# Patient Record
Sex: Female | Born: 1965 | Race: White | Hispanic: No | Marital: Married | Smoking: Former smoker
Health system: Southern US, Community
[De-identification: ages and names within clinical notes are randomized; demographics above are authoritative.]

## PROBLEM LIST (undated history)

## (undated) DIAGNOSIS — F329 Major depressive disorder, single episode, unspecified: Secondary | ICD-10-CM

## (undated) DIAGNOSIS — F419 Anxiety disorder, unspecified: Secondary | ICD-10-CM

## (undated) DIAGNOSIS — F32A Depression, unspecified: Secondary | ICD-10-CM

## (undated) HISTORY — DX: Depression, unspecified: F32.A

## (undated) HISTORY — DX: Anxiety disorder, unspecified: F41.9

## (undated) HISTORY — DX: Major depressive disorder, single episode, unspecified: F32.9

## (undated) HISTORY — PX: TUBAL LIGATION: SHX77

## (undated) HISTORY — PX: OTHER SURGICAL HISTORY: SHX169

---

## 2011-11-26 ENCOUNTER — Other Ambulatory Visit: Payer: Self-pay | Admitting: Obstetrics and Gynecology

## 2011-11-26 DIAGNOSIS — R928 Other abnormal and inconclusive findings on diagnostic imaging of breast: Secondary | ICD-10-CM

## 2011-12-04 ENCOUNTER — Other Ambulatory Visit: Payer: Self-pay

## 2011-12-09 ENCOUNTER — Ambulatory Visit
Admission: RE | Admit: 2011-12-09 | Discharge: 2011-12-09 | Disposition: A | Discharge status: Home / Self Care | Payer: BC Managed Care – PPO | Source: Ambulatory Visit | Attending: Obstetrics and Gynecology | Admitting: Obstetrics and Gynecology

## 2011-12-09 DIAGNOSIS — R928 Other abnormal and inconclusive findings on diagnostic imaging of breast: Secondary | ICD-10-CM

## 2012-12-16 ENCOUNTER — Other Ambulatory Visit: Payer: Self-pay | Admitting: Family Medicine

## 2012-12-17 NOTE — Telephone Encounter (Signed)
LAST RF 09/27/12. CALL IN [facility]

## 2012-12-20 ENCOUNTER — Other Ambulatory Visit: Payer: Self-pay | Admitting: Family Medicine

## 2013-04-12 ENCOUNTER — Other Ambulatory Visit: Payer: Self-pay | Admitting: Nurse Practitioner

## 2013-04-13 NOTE — Telephone Encounter (Signed)
Last seen 09/09/12  MMM  If approved route to nurse to call in

## 2013-04-13 NOTE — Telephone Encounter (Signed)
Please call in ambien rx with 0 refill- NTBS for future refills

## 2013-04-13 NOTE — Telephone Encounter (Signed)
Med called to pharm 

## 2013-04-15 ENCOUNTER — Other Ambulatory Visit: Payer: Self-pay | Admitting: Nurse Practitioner

## 2013-06-15 ENCOUNTER — Other Ambulatory Visit: Payer: Self-pay | Admitting: Nurse Practitioner

## 2013-06-17 NOTE — Telephone Encounter (Signed)
Last seen 09/09/12, last filled 04/12/13. Route to pool if approved and call into [place]

## 2013-06-17 NOTE — Telephone Encounter (Signed)
Rx called to [facility].

## 2013-06-17 NOTE — Telephone Encounter (Signed)
Please call in ambien rx 

## 2013-08-19 ENCOUNTER — Encounter: Payer: Self-pay | Admitting: General Practice

## 2013-08-19 ENCOUNTER — Ambulatory Visit (INDEPENDENT_AMBULATORY_CARE_PROVIDER_SITE_OTHER): Payer: 59 | Admitting: General Practice

## 2013-08-19 VITALS — BP 139/90 | HR 73 | Temp 97.5°F | Ht 64.0 in | Wt 217.5 lb

## 2013-08-19 DIAGNOSIS — R635 Abnormal weight gain: Secondary | ICD-10-CM

## 2013-08-19 DIAGNOSIS — Z713 Dietary counseling and surveillance: Secondary | ICD-10-CM

## 2013-08-19 NOTE — Patient Instructions (Signed)

## 2013-08-19 NOTE — Progress Notes (Signed)
   Subjective:    Patient ID: Leslie Abbott, female    DOB: 1966-07-29, 48 y.o.   MRN: [id number]  HPI Patient presents today stating, " I want to discuss losing weight." Reports she has tried to walk, 3 times a week, but discontinued due to lack of will power. Reports eating a non healthy diet including sausage biscuits, pastas, and  breads. Discussed importance of healthy eating and regular exercise for weight loss. Patient interested in weight loss medication. Denies having any lab work in many years and declines today. Provider explained importance of having basic labs performed.    Review of Systems  Constitutional: Negative for fever and chills.  Respiratory: Negative for chest tightness and shortness of breath.   Cardiovascular: Negative for chest pain and palpitations.  All other systems reviewed and are negative.       Objective:   Physical Exam  Constitutional: She is oriented to person, place, and time. She appears well-developed and well-nourished.  obese  HENT:  Head: Normocephalic and atraumatic.  Right Ear: External ear normal.  Left Ear: External ear normal.  Mouth/Throat: Oropharynx is clear and moist.  Eyes: Pupils are equal, round, and reactive to light.  Neck: Normal range of motion. Neck supple.  Cardiovascular: Normal rate, regular rhythm and normal heart sounds.   Pulmonary/Chest: Effort normal and breath sounds normal. No respiratory distress. She exhibits no tenderness.  Abdominal: Bowel sounds are normal. She exhibits no distension. There is no tenderness.  Neurological: She is alert and oriented to person, place, and time.  Skin: Skin is warm and dry.  Psychiatric: She has a normal mood and affect.          Assessment & Plan:  1. Weight loss counseling, encounter for -discussed importance of having basic labs drawn -discussed lifestyle modifications for weight reduction (such as healthy eating and some form of regular exercise) -should meet with  nutritionist to develop healthy eating plan -Patient verbalized understanding Coralie KeensMae E. Zellie Jenning, FNP-C

## 2013-09-08 ENCOUNTER — Ambulatory Visit (INDEPENDENT_AMBULATORY_CARE_PROVIDER_SITE_OTHER): Payer: 59 | Admitting: Pharmacist

## 2013-09-08 ENCOUNTER — Encounter: Payer: Self-pay | Admitting: Pharmacist

## 2013-09-08 VITALS — BP 120/72 | HR 74 | Ht 64.0 in | Wt 211.0 lb

## 2013-09-08 DIAGNOSIS — R635 Abnormal weight gain: Secondary | ICD-10-CM

## 2013-09-08 DIAGNOSIS — E669 Obesity, unspecified: Secondary | ICD-10-CM | POA: Insufficient documentation

## 2013-09-08 NOTE — Progress Notes (Signed)
Subjective:     Leslie Abbott is a 48 y.o. female here for discussion regarding weight loss. She has noted a weight gain of approximately 50 pounds over the last 3 years. She feels ideal weight is 170 pounds. Weight at graduation from high school was 145 pounds. History of eating disorders: none. There is a family history positive for obesity in the patient and sister. Previous treatments for obesity include commercial weight loss program: - [employer] [facility], self-directed dieting and Weight Watchers. She reports that she lost about 50 lbs with [employer] and about 15 lbs with [employer].  Obesity associated medical conditions: depression and hyperlipidemia. Obesity associated medications: none. Cardiovascular risk factors besides obesity: dyslipidemia, obesity (BMI >= 30 kg/m2) and sedentary lifestyle. . The following portions of the patient's history were reviewed and updated as appropriate: allergies, current medications, past family history, past medical history, past social history, past surgical history and problem list.  Reviewed labs from 2013 in patients paper chart - showed hyperlipidemia and vitamin D insufficiency in 2013.  Objective:    Body mass index is 36.2 kg/(m^2).  Filed Weights   09/08/13 0935  Weight: 211 lb (95.709 kg)    Filed Vitals:   09/08/13 0935  BP: 120/72  Pulse: 74      Assessment:    Obesity. I assessed [name] to be in an action stage with respect to weight loss.    Plan:    General weight loss/lifestyle modification strategies discussed (elicit support from others; identify saboteurs; non-food rewards, etc). Behavioral treatment: stress management. Diet interventions: moderate (500 kCal/d) deficit diet. Informal exercise measures discussed, e.g. taking stairs instead of elevator.  Patient is encouraged to have lipids, thyroid, vitamin D and CMP checked - she refused labs tests.    RTC in 4-6 weeks.   Henrene Pastorammy Cori Justus, PharmD, CPP

## 2013-09-26 ENCOUNTER — Other Ambulatory Visit: Payer: Self-pay | Admitting: Nurse Practitioner

## 2013-09-27 NOTE — Telephone Encounter (Signed)
Last seen 08/19/13  Leslie Abbott  If approved route to nurse to call into [facility] 

## 2013-09-30 ENCOUNTER — Other Ambulatory Visit: Payer: Self-pay | Admitting: Nurse Practitioner

## 2013-10-03 NOTE — Telephone Encounter (Signed)
Last seen 08/19/13  Leslie Abbott  [facility]

## 2013-10-04 ENCOUNTER — Other Ambulatory Visit: Payer: Self-pay | Admitting: General Practice

## 2013-10-11 ENCOUNTER — Other Ambulatory Visit: Payer: Self-pay | Admitting: Nurse Practitioner

## 2013-10-14 NOTE — Telephone Encounter (Signed)
Patient last seen in office on 1-22 by Tammy and on 1-2 by Mae. Please advise. If approved please route to Pool B so nurse can phone in to pharmacy

## 2013-10-14 NOTE — Telephone Encounter (Signed)
Rx called to [facility]

## 2013-10-14 NOTE — Telephone Encounter (Signed)
Please call in amben with 0 refills

## 2013-10-20 ENCOUNTER — Ambulatory Visit: Payer: Self-pay

## 2013-10-20 ENCOUNTER — Telehealth: Payer: Self-pay | Admitting: *Deleted

## 2013-10-20 NOTE — Telephone Encounter (Signed)
Let patien tknow that ins will only cover the plain ambien not the CR- is this okay with her

## 2013-10-20 NOTE — Telephone Encounter (Signed)
Ok

## 2013-10-20 NOTE — Telephone Encounter (Signed)
MM, I got a letter back on the zolpidem saying they cannot perform this prior authorization  because this product is a plan exclusion for this member so there is no coverage criteria to review, it says she can call the plan for more info , so I'm going to try tp call her, will keep you posted.

## 2013-10-20 NOTE — Telephone Encounter (Signed)
MM [name] called back and said the options were zolpedem and zalepalon, I called the ins co back and they said they will cover zolpidem either 5mg  or 10 mg but not the tartrate er and they will cover sonata.  Will any of these work?  Thanks

## 2013-10-25 ENCOUNTER — Telehealth: Payer: Self-pay | Admitting: *Deleted

## 2013-10-25 NOTE — Telephone Encounter (Signed)
Talked with Leslie Abbott today and she wants to try the zolpidem plain if you would send to [facility] she would appreciate it.  thanks

## 2013-10-25 NOTE — Telephone Encounter (Signed)
Please let patient know [place]

## 2013-10-28 ENCOUNTER — Telehealth: Payer: Self-pay | Admitting: Nurse Practitioner

## 2013-10-28 MED ORDER — ZOLPIDEM TARTRATE 10 MG PO TABS: 10.0000 mg | ORAL_TABLET | Freq: Every evening | ORAL | Status: DC | PRN

## 2013-10-28 NOTE — Telephone Encounter (Signed)
Left refill authorization on pharmacy voicemail. 

## 2013-10-28 NOTE — Telephone Encounter (Signed)
Talked with [name] and told her that mm would be calling in her medication for sleep today.

## 2013-10-28 NOTE — Telephone Encounter (Signed)
Ambien was called into pharmacy 

## 2013-10-28 NOTE — Telephone Encounter (Signed)
Please call in ambien 10 mg  1 po qhs #30 with 1 refills 

## 2013-10-28 NOTE — Telephone Encounter (Signed)
Patient ok with plain [place]

## 2013-10-31 ENCOUNTER — Telehealth: Payer: Self-pay | Admitting: *Deleted

## 2013-10-31 NOTE — Telephone Encounter (Signed)
Called [facility] on 3-13 1 and told her that MM had called in her Remus Lofflerambien and she said that was find.

## 2013-11-05 IMAGING — MG MM DIGITAL DIAGNOSTIC UNILAT*R*
4 series · 4 of 4 positions shown · non-contrast
Comparison: 11/07/2010, 10/24/2009, 09/05/2008 from [REDACTED].

CLINICAL DATA: The patient returns for evaluation of a possible
mass in the right breast noted on recent screening study dated
11/20/2011.

DIGITAL DIAGNOSTIC RIGHT MAMMOGRAM

[R CC]
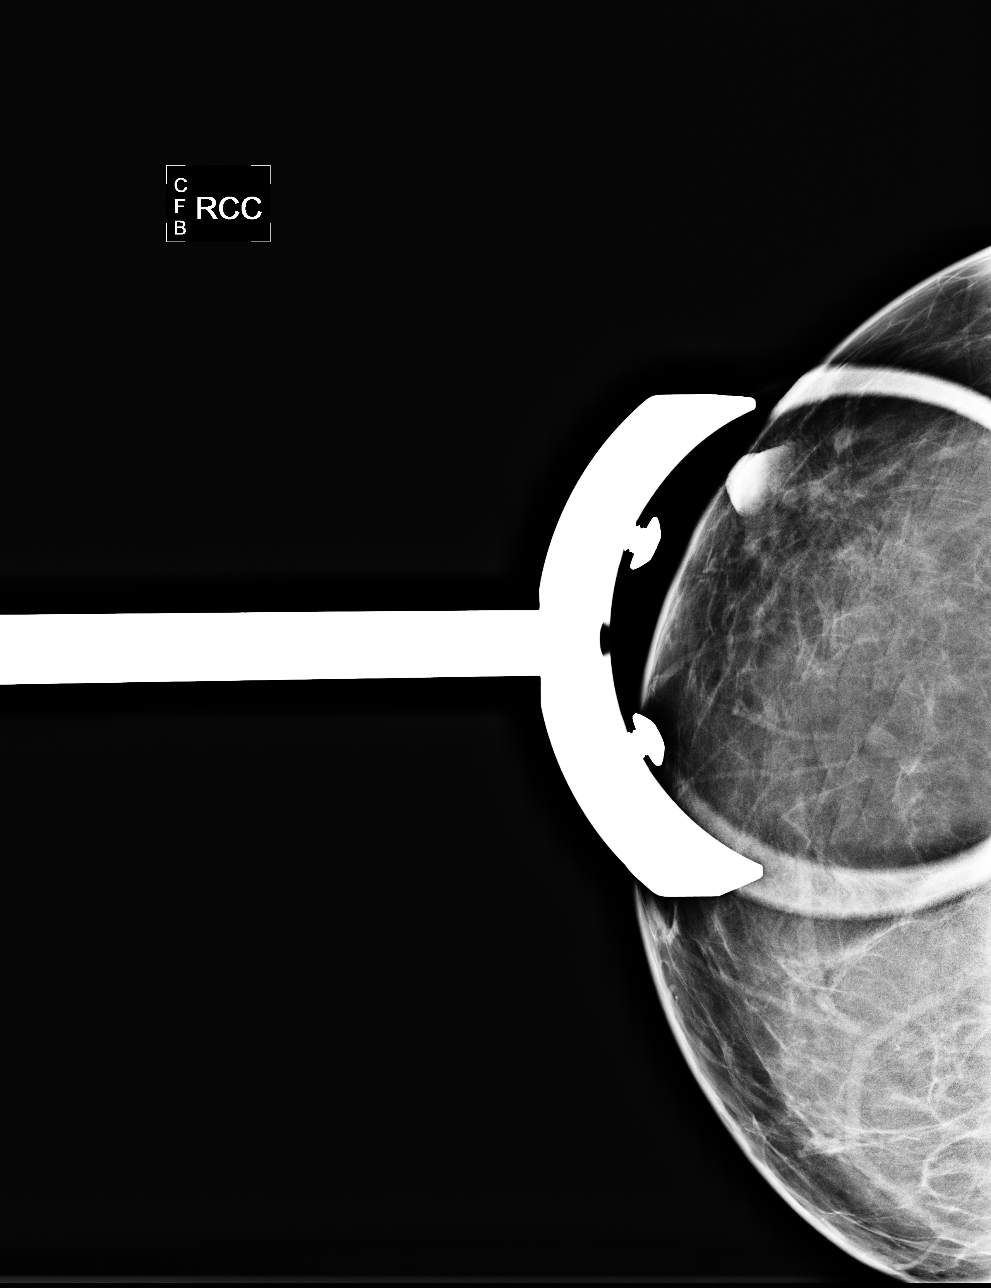

[R MLO (1 of 3)]
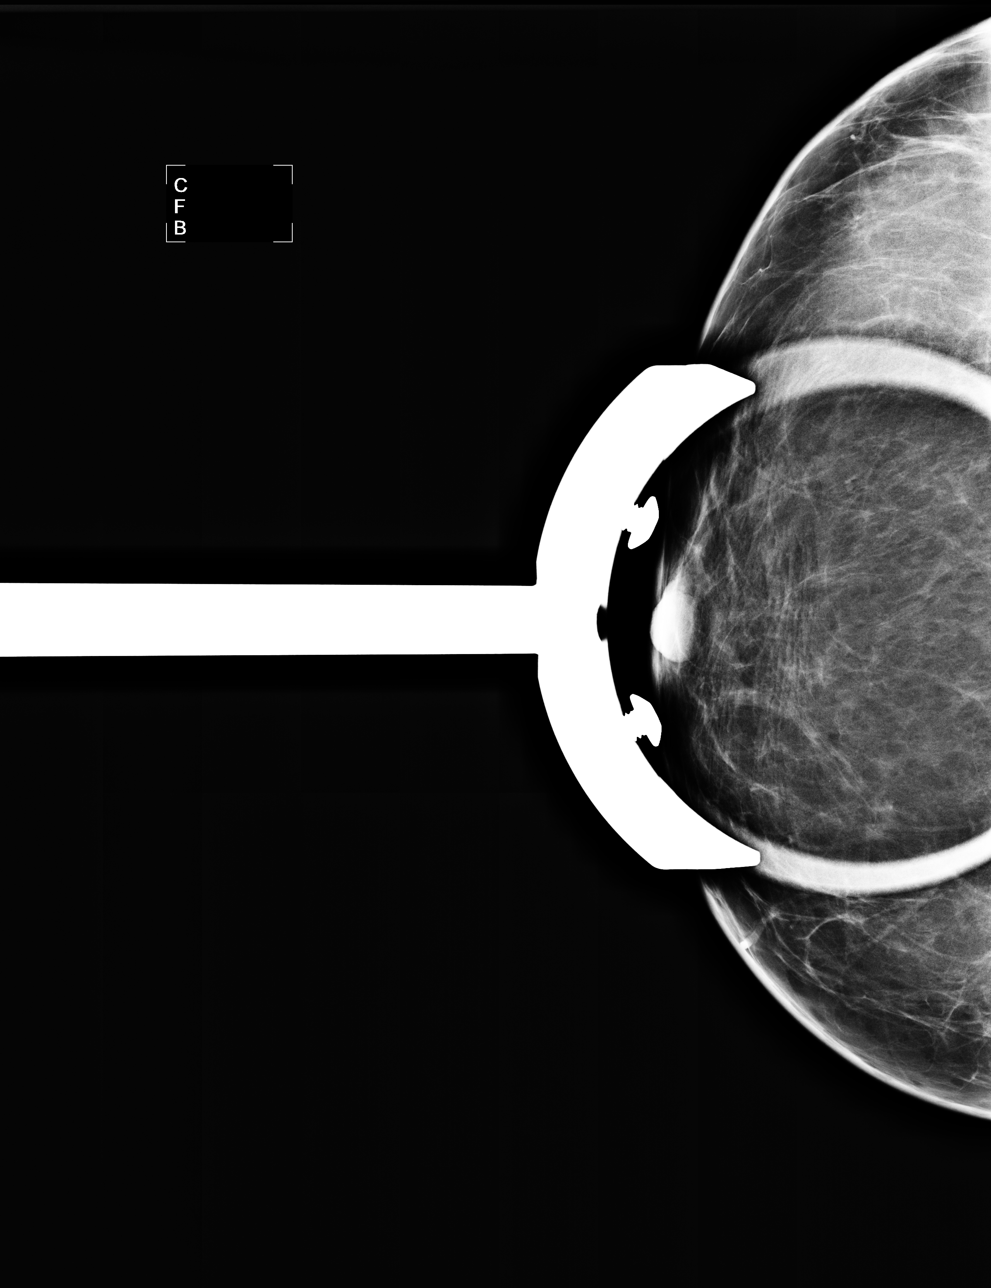

[R MLO (2 of 3)]
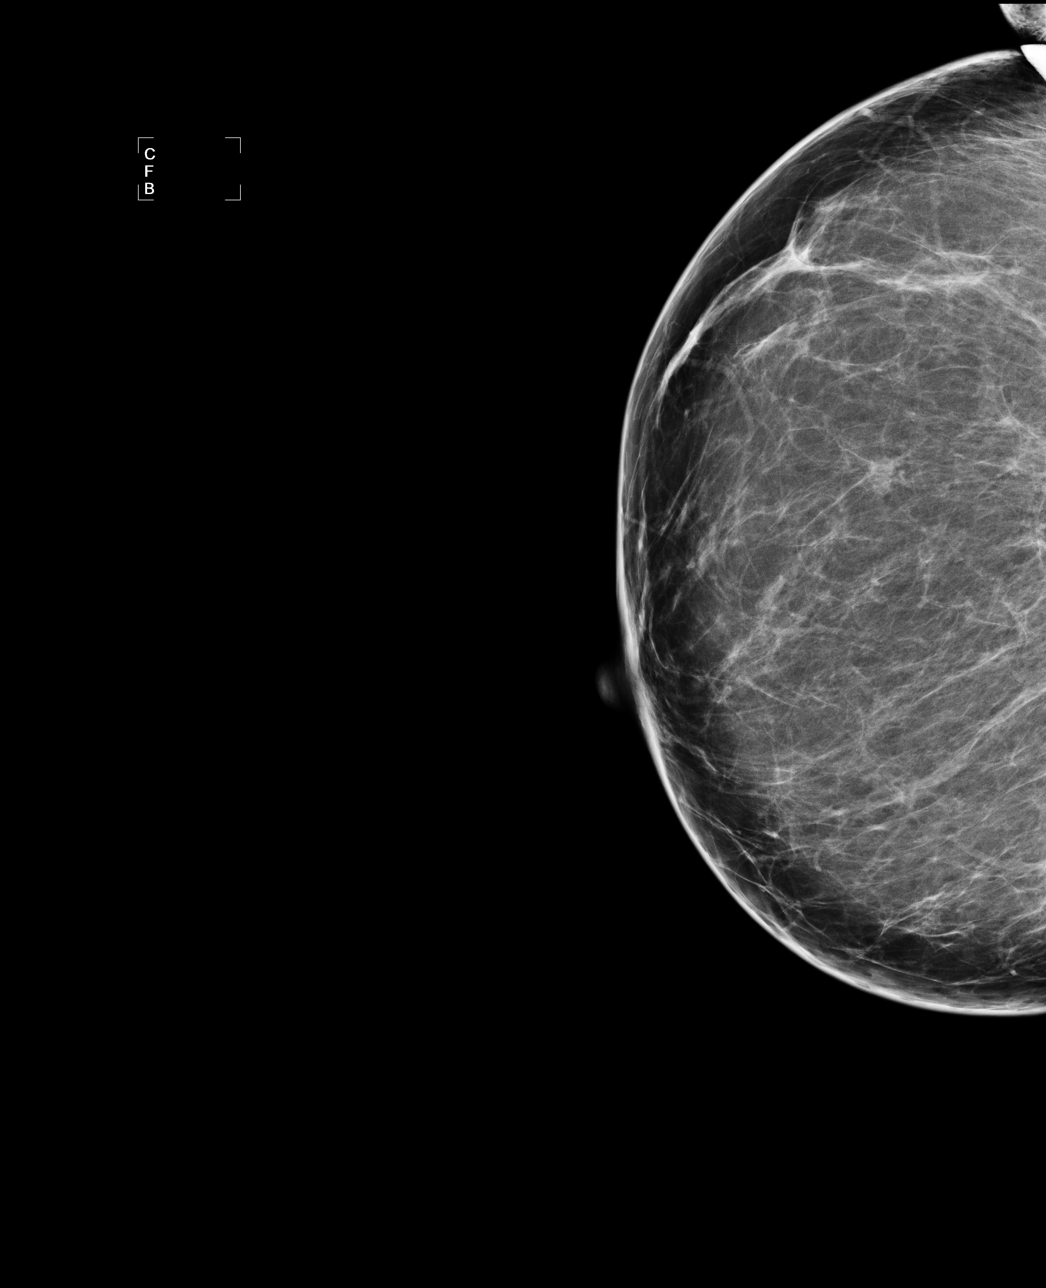

[R MLO (3 of 3)]
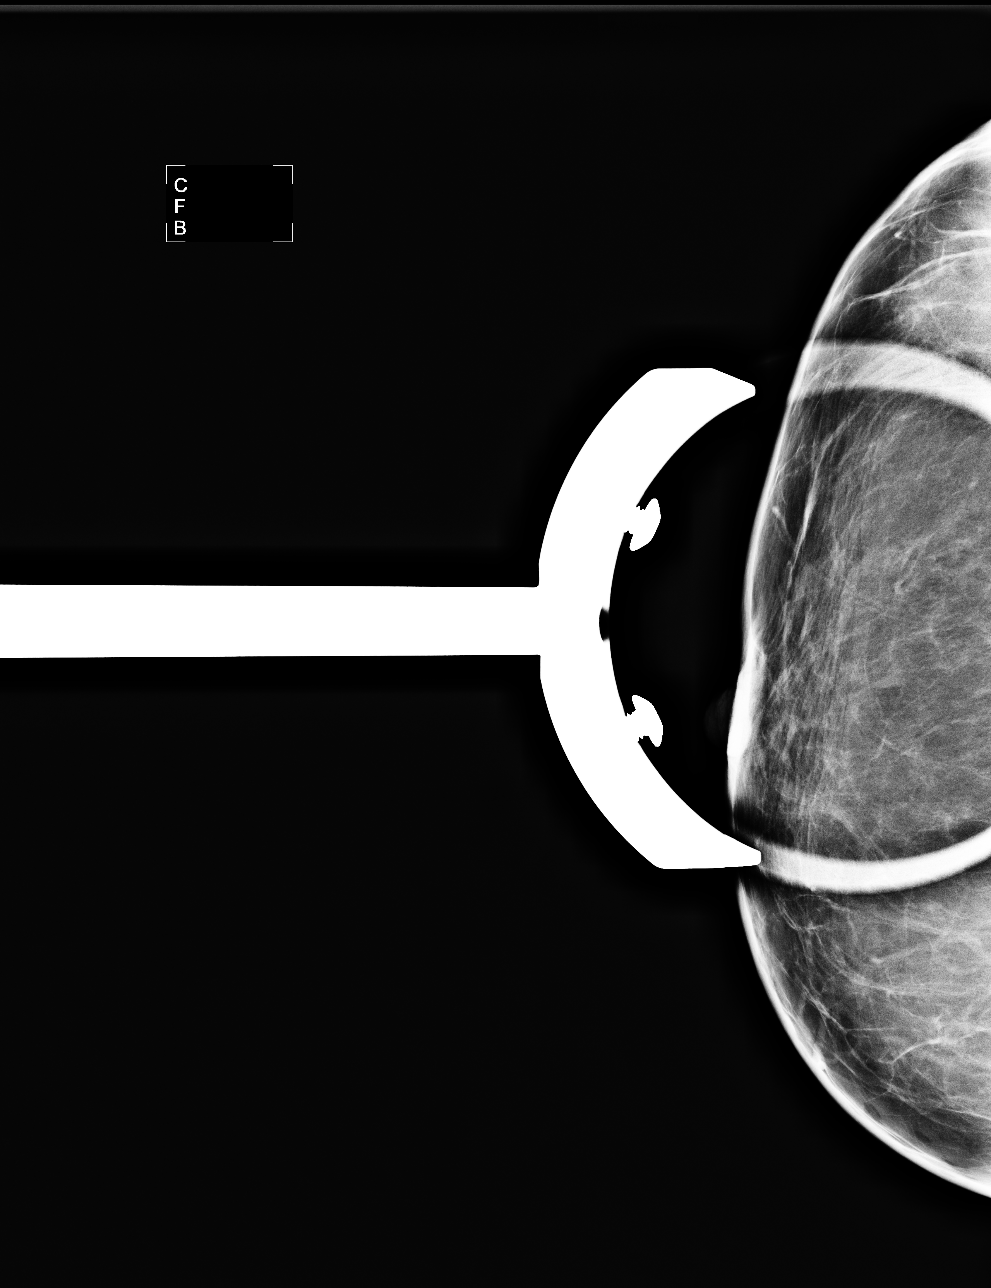

[4 of 4 positions shown; findings below may reference images not displayed]

FINDINGS: Additional views demonstrate no persistent mass or
distortion in the right subareolar region.
IMPRESSION: No persistent worrisome abnormality upon additional imaging of the
right breast.  Yearly screening mammography is suggested.

BI-RADS CATEGORY 1:  Negative.

## 2014-07-24 ENCOUNTER — Other Ambulatory Visit: Payer: Self-pay | Admitting: Nurse Practitioner

## 2014-08-02 ENCOUNTER — Other Ambulatory Visit: Payer: Self-pay | Admitting: Nurse Practitioner

## 2014-08-03 ENCOUNTER — Other Ambulatory Visit: Payer: Self-pay | Admitting: Nurse Practitioner

## 2014-09-27 ENCOUNTER — Encounter: Payer: Self-pay | Admitting: Nurse Practitioner

## 2014-09-27 ENCOUNTER — Ambulatory Visit (INDEPENDENT_AMBULATORY_CARE_PROVIDER_SITE_OTHER): Payer: 59 | Admitting: Nurse Practitioner

## 2014-09-27 VITALS — BP 150/98 | HR 72 | Temp 97.3°F | Ht 64.0 in | Wt 218.0 lb

## 2014-09-27 DIAGNOSIS — F17200 Nicotine dependence, unspecified, uncomplicated: Secondary | ICD-10-CM

## 2014-09-27 DIAGNOSIS — G4700 Insomnia, unspecified: Secondary | ICD-10-CM

## 2014-09-27 DIAGNOSIS — E669 Obesity, unspecified: Secondary | ICD-10-CM

## 2014-09-27 MED ORDER — ZOLPIDEM TARTRATE 10 MG PO TABS: 10.0000 mg | ORAL_TABLET | Freq: Every evening | ORAL | Status: DC | PRN

## 2014-09-27 MED ORDER — VARENICLINE TARTRATE 0.5 MG X 11 & 1 MG X 42 PO MISC: ORAL | Status: DC

## 2014-09-27 MED ORDER — VARENICLINE TARTRATE 0.5 MG PO TABS: 0.5000 mg | ORAL_TABLET | Freq: Two times a day (BID) | ORAL | Status: DC

## 2014-09-27 NOTE — Progress Notes (Signed)
   Subjective:    Patient ID: Leslie Abbott, female    DOB: 1966-08-10, 49 y.o.   MRN: [id number]  HPI Patient is here for chronic disease follow up. She reports doing well. No acute complaint today. She reports wanting to start chantix to help with smoking cessation.   Insomnia: currently taking Ambien and is doing well, no side effect reported.   Review of Systems  Constitutional: Negative.   HENT: Negative.   Eyes: Negative.   Respiratory: Negative.   Cardiovascular: Negative.   Gastrointestinal: Negative.   Endocrine: Negative.   Genitourinary: Negative.   Musculoskeletal: Negative.   Skin: Negative.   Allergic/Immunologic: Negative.   Neurological: Negative.   Hematological: Negative.   Psychiatric/Behavioral: Negative.        Objective:   Physical Exam  Constitutional: She is oriented to person, place, and time. She appears well-developed and well-nourished.  HENT:  Head: Normocephalic.  Eyes: Pupils are equal, round, and reactive to light.  Neck: Normal range of motion.  Cardiovascular: Normal rate.   Pulmonary/Chest: Effort normal.  Abdominal: Soft.  Musculoskeletal: Normal range of motion.  Neurological: She is alert and oriented to person, place, and time.  Skin: Skin is warm.  Psychiatric: She has a normal mood and affect. Her behavior is normal. Judgment and thought content normal.    BP 150/98 mmHg  Pulse 72  Temp(Src) 97.3 F (36.3 C) (Oral)  Ht _0  (1.626 m)  Wt 218 lb (98.884 kg)  BMI 37.40 kg/m2       Assessment & Plan:  1. Obesity (BMI 30-39.9) Discussed diet and exercise for person with BMI >25 Will recheck weight in 3-6 months  - NMR, lipoprofile - CMP14+EGFR  2. Insomnia Bedtime ritual - ambien 50m 1 po qHS #30 2 rf 3. Chain smoker Smoke th efirst 13 days that take meds- then STOP! - varenicline (CHANTIX STARTING MONTH PAK) 0.5 MG X 11 & 1 MG X 42 tablet; As directed on package  Dispense: 53 tablet; Refill: 0 - varenicline  (CHANTIX) 0.5 MG tablet; Take 1 tablet (0.5 mg total) by mouth 2 (two) times daily.  Dispense: 60 tablet; Refill: 2    Labs pending Health maintenance reviewed Diet and exercise encouraged Continue all meds Follow up  In 6 months   [place] FNP

## 2014-09-27 NOTE — Patient Instructions (Signed)
Smoking Cessation Quitting smoking is important to your health and has many advantages. However, it is not always easy to quit since nicotine is a very addictive drug. Oftentimes, people try 3 times or more before being able to quit. This document explains the best ways for you to prepare to quit smoking. Quitting takes hard work and a lot of effort, but you can do it. ADVANTAGES OF QUITTING SMOKING  You will live longer, feel better, and live better.  Your body will feel the impact of quitting smoking almost immediately.  Within 20 minutes, blood pressure decreases. Your pulse returns to its normal level.  After 8 hours, carbon monoxide levels in the blood return to normal. Your oxygen level increases.  After 24 hours, the chance of having a heart attack starts to decrease. Your breath, hair, and body stop smelling like smoke.  After 48 hours, damaged nerve endings begin to recover. Your sense of taste and smell improve.  After 72 hours, the body is virtually free of nicotine. Your bronchial tubes relax and breathing becomes easier.  After 2 to 12 weeks, lungs can hold more air. Exercise becomes easier and circulation improves.  The risk of having a heart attack, stroke, cancer, or lung disease is greatly reduced.  After 1 year, the risk of coronary heart disease is cut in half.  After 5 years, the risk of stroke falls to the same as a nonsmoker.  After 10 years, the risk of lung cancer is cut in half and the risk of other cancers decreases significantly.  After 15 years, the risk of coronary heart disease drops, usually to the level of a nonsmoker.  If you are pregnant, quitting smoking will improve your chances of having a healthy baby.  The people you live with, especially any children, will be healthier.  You will have extra money to spend on things other than cigarettes. QUESTIONS TO THINK ABOUT BEFORE ATTEMPTING TO QUIT You may want to talk about your answers with your  health care provider.  Why do you want to quit?  If you tried to quit in the past, what helped and what did not?  What will be the most difficult situations for you after you quit? How will you plan to handle them?  Who can help you through the tough times? Your family? Friends? A health care provider?  What pleasures do you get from smoking? What ways can you still get pleasure if you quit? Here are some questions to ask your health care provider:  How can you help me to be successful at quitting?  What medicine do you think would be best for me and how should I take it?  What should I do if I need more help?  What is smoking withdrawal like? How can I get information on withdrawal? GET READY  Set a quit date.  Change your environment by getting rid of all cigarettes, ashtrays, matches, and lighters in your home, car, or work. Do not let people smoke in your home.  Review your past attempts to quit. Think about what worked and what did not. GET SUPPORT AND ENCOURAGEMENT You have a better chance of being successful if you have help. You can get support in many ways.  Tell your family, friends, and coworkers that you are going to quit and need their support. Ask them not to smoke around you.  Get individual, group, or telephone counseling and support. Programs are available at local hospitals and health centers. Call   your local health department for information about programs in your area.  Spiritual beliefs and practices may help some smokers quit.  Download a "quit meter" on your computer to keep track of quit statistics, such as how long you have gone without smoking, cigarettes not smoked, and money saved.  Get a self-help book about quitting smoking and staying off tobacco. LEARN NEW SKILLS AND BEHAVIORS  Distract yourself from urges to smoke. Talk to someone, go for a walk, or occupy your time with a task.  Change your normal routine. Take a different route to work.  Drink tea instead of coffee. Eat breakfast in a different place.  Reduce your stress. Take a hot bath, exercise, or read a book.  Plan something enjoyable to do every day. Reward yourself for not smoking.  Explore interactive web-based programs that specialize in helping you quit. GET MEDICINE AND USE IT CORRECTLY Medicines can help you stop smoking and decrease the urge to smoke. Combining medicine with the above behavioral methods and support can greatly increase your chances of successfully quitting smoking.  Nicotine replacement therapy helps deliver nicotine to your body without the negative effects and risks of smoking. Nicotine replacement therapy includes nicotine gum, lozenges, inhalers, nasal sprays, and skin patches. Some may be available over-the-counter and others require a prescription.  Antidepressant medicine helps people abstain from smoking, but how this works is unknown. This medicine is available by prescription.  Nicotinic receptor partial agonist medicine simulates the effect of nicotine in your brain. This medicine is available by prescription. Ask your health care provider for advice about which medicines to use and how to use them based on your health history. Your health care provider will tell you what side effects to look out for if you choose to be on a medicine or therapy. Carefully read the information on the package. Do not use any other product containing nicotine while using a nicotine replacement product.  RELAPSE OR DIFFICULT SITUATIONS Most relapses occur within the first 3 months after quitting. Do not be discouraged if you start smoking again. Remember, most people try several times before finally quitting. You may have symptoms of withdrawal because your body is used to nicotine. You may crave cigarettes, be irritable, feel very hungry, cough often, get headaches, or have difficulty concentrating. The withdrawal symptoms are only temporary. They are strongest  when you first quit, but they will go away within 10-14 days. To reduce the chances of relapse, try to:  Avoid drinking alcohol. Drinking lowers your chances of successfully quitting.  Reduce the amount of caffeine you consume. Once you quit smoking, the amount of caffeine in your body increases and can give you symptoms, such as a rapid heartbeat, sweating, and anxiety.  Avoid smokers because they can make you want to smoke.  Do not let weight gain distract you. Many smokers will gain weight when they quit, usually less than 10 pounds. Eat a healthy diet and stay active. You can always lose the weight gained after you quit.  Find ways to improve your mood other than smoking. FOR MORE INFORMATION  www.smokefree.gov  Document Released: 07/29/2001 Document Revised: 12/19/2013 Document Reviewed: 11/13/2011 Exit[employer] Patient Information 2015 [employer]. This information is not intended to replace advice given to you by your health care provider. Make sure you discuss any questions you have with your health care provider.  

## 2014-10-25 ENCOUNTER — Other Ambulatory Visit: Payer: Self-pay | Admitting: Nurse Practitioner

## 2014-10-26 ENCOUNTER — Other Ambulatory Visit: Payer: Self-pay | Admitting: *Deleted

## 2014-10-26 MED ORDER — VARENICLINE TARTRATE 1 MG PO TABS: 1.0000 mg | ORAL_TABLET | Freq: Two times a day (BID) | ORAL | Status: DC

## 2015-01-08 ENCOUNTER — Ambulatory Visit: Payer: 59 | Admitting: Family Medicine

## 2015-03-09 ENCOUNTER — Ambulatory Visit: Payer: 59 | Admitting: Family

## 2015-03-14 ENCOUNTER — Encounter: Payer: Self-pay | Admitting: Family

## 2015-03-14 ENCOUNTER — Ambulatory Visit (INDEPENDENT_AMBULATORY_CARE_PROVIDER_SITE_OTHER): Payer: 59 | Admitting: Family

## 2015-03-14 VITALS — BP 123/85 | HR 83 | Temp 97.8°F | Ht 64.0 in | Wt 216.6 lb

## 2015-03-14 DIAGNOSIS — L0291 Cutaneous abscess, unspecified: Secondary | ICD-10-CM

## 2015-03-14 NOTE — Patient Instructions (Signed)

## 2015-03-14 NOTE — Progress Notes (Signed)
   Subjective:    Patient ID: Leslie Abbott, female    DOB: [birth year], 49 y.o.   MRN: [id number]  HPI Pt presents to the office today for a abscess under her right axillary. Pt states she noticed it a few weeks ago, but last week in the shower she noticed it had gotten bigger. Pt states last Thursday she went to a Dermatologists  who placed her on Doxycycline for 14 days and told her to use warm compresses. Pt states it started draining "white stuff" on Saturday. Pt states her arm is still red and slightly tender. Pt states she has not "squeezed or touched it" and states she is letting it drain on it's own.    Review of Systems  Constitutional: Negative.   HENT: Negative.   Eyes: Negative.   Respiratory: Negative.  Negative for shortness of breath.   Cardiovascular: Negative.  Negative for palpitations.  Gastrointestinal: Negative.   Endocrine: Negative.   Genitourinary: Negative.   Musculoskeletal: Negative.   Neurological: Negative.  Negative for headaches.  Hematological: Negative.   Psychiatric/Behavioral: Negative.   All other systems reviewed and are negative.      Objective:   Physical Exam  Constitutional: She is oriented to person, place, and time. She appears well-developed and well-nourished. No distress.  HENT:  Head: Normocephalic and atraumatic.  Eyes: Pupils are equal, round, and reactive to light.  Neck: Normal range of motion. Neck supple. No thyromegaly present.  Cardiovascular: Normal rate, regular rhythm, normal heart sounds and intact distal pulses.   No murmur heard. Pulmonary/Chest: Effort normal and breath sounds normal. No respiratory distress. She has no wheezes.  Abdominal: Soft. Bowel sounds are normal. She exhibits no distension. There is no tenderness.  Musculoskeletal: Normal range of motion. She exhibits no edema or tenderness.  Neurological: She is alert and oriented to person, place, and time. She has normal reflexes. No cranial nerve deficit.    Skin: Skin is warm and dry. There is erythema.  Abscess under right axillary with mild erythemas- I&D with a large hard center removed   Psychiatric: She has a normal mood and affect. Her behavior is normal. Judgment and thought content normal.  Vitals reviewed.   BP 123/85 mmHg  Pulse 83  Temp(Src) 97.8 F (36.6 C) (Oral)  Ht  (1.626 m)  Wt 216 lb 9.6 oz (98.249 kg)  BMI 37.16 kg/m2       Assessment & Plan:  1. Abscess -Continue doxycycline  -Warm compresses  -Do not pick or squeeze -RTO if redness or swelling becomes worse or as needed  Jannifer Rodney, FNP

## 2015-04-17 ENCOUNTER — Ambulatory Visit (INDEPENDENT_AMBULATORY_CARE_PROVIDER_SITE_OTHER): Payer: 59 | Admitting: Pediatrics

## 2015-04-17 ENCOUNTER — Encounter: Payer: Self-pay | Admitting: Pediatrics

## 2015-04-17 VITALS — BP 125/83 | HR 72 | Temp 97.4°F | Ht 64.0 in | Wt 211.6 lb

## 2015-04-17 DIAGNOSIS — M545 Low back pain: Secondary | ICD-10-CM | POA: Diagnosis not present

## 2015-04-17 MED ORDER — CYCLOBENZAPRINE HCL 5 MG PO TABS: 5.0000 mg | ORAL_TABLET | Freq: Every day | ORAL | Status: DC

## 2015-04-17 NOTE — Patient Instructions (Addendum)
Back Exercises Back exercises help treat and prevent back injuries. The goal of back exercises is to increase the strength of your abdominal and back muscles and the flexibility of your back. These exercises should be started when you no longer have back pain. Back exercises include:  Pelvic Tilt. Lie on your back with your knees bent. Tilt your pelvis until the lower part of your back is against the floor. Hold this position 5 to 10 sec and repeat 5 to 10 times.  Knee to Chest. Pull first 1 knee up against your chest and hold for 20 to 30 seconds, repeat this with the other knee, and then both knees. This may be done with the other leg straight or bent, whichever feels better.  Sit-Ups or Curl-Ups. Bend your knees 90 degrees. Start with tilting your pelvis, and do a partial, slow sit-up, lifting your trunk only 30 to 45 degrees off the floor. Take at least 2 to 3 seconds for each sit-up. Do not do sit-ups with your knees out straight. If partial sit-ups are difficult, simply do the above but with only tightening your abdominal muscles and holding it as directed.  Hip-Lift. Lie on your back with your knees flexed 90 degrees. Push down with your feet and shoulders as you raise your hips a couple inches off the floor; hold for 10 seconds, repeat 5 to 10 times.  Back arches. Lie on your stomach, propping yourself up on bent elbows. Slowly press on your hands, causing an arch in your low back. Repeat 3 to 5 times. Any initial stiffness and discomfort should lessen with repetition over time.  Shoulder-Lifts. Lie face down with arms beside your body. Keep hips and torso pressed to floor as you slowly lift your head and shoulders off the floor. Do not overdo your exercises, especially in the beginning. Exercises may cause you some mild back discomfort which lasts for a few minutes; however, if the pain is more severe, or lasts for more than 15 minutes, do not continue exercises until you see your caregiver.  Improvement with exercise therapy for back problems is slow.  See your caregivers for assistance with developing a proper back exercise program. Document Released: 09/11/2004 Document Revised: 10/27/2011 Document Reviewed: 06/05/2011 [facility] Patient Information 2015 [place], Leslie Abbott. This information is not intended to replace advice given to you by your health care provider. Make sure you discuss any questions you have with your health care provider.   Back Exercises These exercises may help you when beginning to rehabilitate your injury. Your symptoms may resolve with or without further involvement from your physician, physical therapist or athletic trainer. While completing these exercises, remember:   Restoring tissue flexibility helps normal motion to return to the joints. This allows healthier, less painful movement and activity.  An effective stretch should be held for at least 30 seconds.  A stretch should never be painful. You should only feel a gentle lengthening or release in the stretched tissue. STRETCH - Extension, Prone on Elbows   Lie on your stomach on the floor, a bed will be too soft. Place your palms about shoulder width apart and at the height of your head.  Place your elbows under your shoulders. If this is too painful, stack pillows under your chest.  Allow your body to relax so that your hips drop lower and make contact more completely with the floor.  Hold this position for __________ seconds.  Slowly return to lying flat on the floor. Repeat __________  times. Complete this exercise __________ times per day.  RANGE OF MOTION - Extension, Prone Press Ups   Lie on your stomach on the floor, a bed will be too soft. Place your palms about shoulder width apart and at the height of your head.  Keeping your back as relaxed as possible, slowly straighten your elbows while keeping your hips on the floor. You may adjust the placement of your hands to maximize your  comfort. As you gain motion, your hands will come more underneath your shoulders.  Hold this position __________ seconds.  Slowly return to lying flat on the floor. Repeat __________ times. Complete this exercise __________ times per day.  RANGE OF MOTION- Quadruped, Neutral Spine   Assume a hands and knees position on a firm surface. Keep your hands under your shoulders and your knees under your hips. You may place padding under your knees for comfort.  Drop your head and point your tail bone toward the ground below you. This will round out your low back like an angry cat. Hold this position for __________ seconds.  Slowly lift your head and release your tail bone so that your back sags into a large arch, like an old horse.  Hold this position for __________ seconds.  Repeat this until you feel limber in your low back.  Now, find your "sweet spot." This will be the most comfortable position somewhere between the two previous positions. This is your neutral spine. Once you have found this position, tense your stomach muscles to support your low back.  Hold this position for __________ seconds. Repeat __________ times. Complete this exercise __________ times per day.  STRETCH - Flexion, Single Knee to Chest   Lie on a firm bed or floor with both legs extended in front of you.  Keeping one leg in contact with the floor, bring your opposite knee to your chest. Hold your leg in place by either grabbing behind your thigh or at your knee.  Pull until you feel a gentle stretch in your low back. Hold __________ seconds.  Slowly release your grasp and repeat the exercise with the opposite side. Repeat __________ times. Complete this exercise __________ times per day.  STRETCH - Hamstrings, Standing  Stand or sit and extend your right / left leg, placing your foot on a chair or foot stool  Keeping a slight arch in your low back and your hips straight forward.  Lead with your chest and  lean forward at the waist until you feel a gentle stretch in the back of your right / left knee or thigh. (When done correctly, this exercise requires leaning only a small distance.)  Hold this position for __________ seconds. Repeat __________ times. Complete this stretch __________ times per day. STRENGTHENING - Deep Abdominals, Pelvic Tilt   Lie on a firm bed or floor. Keeping your legs in front of you, bend your knees so they are both pointed toward the ceiling and your feet are flat on the floor.  Tense your lower abdominal muscles to press your low back into the floor. This motion will rotate your pelvis so that your tail bone is scooping upwards rather than pointing at your feet or into the floor.  With a gentle tension and even breathing, hold this position for __________ seconds. Repeat __________ times. Complete this exercise __________ times per day.  STRENGTHENING - Abdominals, Crunches   Lie on a firm bed or floor. Keeping your legs in front of you, bend your knees  so they are both pointed toward the ceiling and your feet are flat on the floor. Cross your arms over your chest.  Slightly tip your chin down without bending your neck.  Tense your abdominals and slowly lift your trunk high enough to just clear your shoulder blades. Lifting higher can put excessive stress on the low back and does not further strengthen your abdominal muscles.  Control your return to the starting position. Repeat __________ times. Complete this exercise __________ times per day.  STRENGTHENING - Quadruped, Opposite UE/LE Lift   Assume a hands and knees position on a firm surface. Keep your hands under your shoulders and your knees under your hips. You may place padding under your knees for comfort.  Find your neutral spine and gently tense your abdominal muscles so that you can maintain this position. Your shoulders and hips should form a rectangle that is parallel with the floor and is not  twisted.  Keeping your trunk steady, lift your right hand no higher than your shoulder and then your left leg no higher than your hip. Make sure you are not holding your breath. Hold this position __________ seconds.  Continuing to keep your abdominal muscles tense and your back steady, slowly return to your starting position. Repeat with the opposite arm and leg. Repeat __________ times. Complete this exercise __________ times per day. Document Released: 08/22/2005 Document Revised: 10/27/2011 Document Reviewed: 11/16/2008 [facility] Patient Information 2015 [place], [place]. This information is not intended to replace advice given to you by your health care provider. Make sure you discuss any questions you have with your health care provider.

## 2015-04-17 NOTE — Progress Notes (Signed)
Subjective:    Patient ID: Leslie Abbott, female    DOB: 29-Mar-1966, 49 y.o.   MRN: [id number]  HPI: Leslie Abbott is a 49 y.o. female presenting on 04/17/2015 for Back Pain  LBP started apprx 1 month ago. Works at [employer] and has to do lots of bending. Hurting across the back. Has tried 4 ibuprofen at a time, does it once a day for 3-4 days. No pains shooting down legs. No weakness in legs. No trouble voiding/stooling or with incontinence.  17 day diet has worked in the past to help lose weight. Now working forced overtime, 60 hours a week, back hurting more since work time increased. Likely to remain increased for at least another month.    Relevant past medical, surgical, family and social history reviewed and updated as indicated. Interim medical history since our last visit reviewed. Allergies and medications reviewed and updated.  Review of Systems  Constitutional: Negative for fever, chills and weight loss.  HENT: Negative for congestion and sore throat.   Eyes: Negative for double vision.  Cardiovascular: Negative for chest pain and palpitations.  Gastrointestinal: Negative for abdominal pain.  Musculoskeletal: Positive for back pain. Negative for joint pain, falls and neck pain.  Skin: Negative for rash.  Neurological: Negative for focal weakness, weakness and headaches.  Psychiatric/Behavioral: Negative for depression. The patient has insomnia.     ROS: Per HPI unless specifically indicated above   Current Outpatient Prescriptions  Medication Sig Dispense Refill  . zolpidem (AMBIEN) 10 MG tablet Take 1 tablet (10 mg total) by mouth at bedtime as needed for sleep. 30 tablet 2  . cyclobenzaprine (FLEXERIL) 5 MG tablet Take 1 tablet (5 mg total) by mouth at bedtime. 30 tablet 0   No current facility-administered medications for this visit.       Objective:    BP 125/83 mmHg  Pulse 72  Temp(Src) 97.4 F (36.3 C) (Oral)  Ht  (1.626 m)  Wt 211 lb 9.6  oz (95.981 kg)  BMI 36.30 kg/m2  Wt Readings from Last 3 Encounters:  04/17/15 211 lb 9.6 oz (95.981 kg)  03/14/15 216 lb 9.6 oz (98.249 kg)  09/27/14 218 lb (98.884 kg)     Gen: NAD, alert, cooperative with exam, NCAT EYES: EOMI, no scleral injection or icterus LYMPH: no cervical LAD CV: NRRR, normal S1/S2, no murmur, DP pulses 2+ b/l Resp: CTABL, no wheezes, normal WOB Ext: No edema, warm Neuro: Alert and oriented, strength equal b/l UE and LE, coordination grossly normal. No point tenderness over back. Tight paraspinous muscles in mid-back b/l. Normal straight leg raise, normal sensation b/l LE.  MSK: normal muscle bulk     Assessment & Plan:   [place] was seen today for back pain that has been present for past month since increase in work time, likely MSK cause. No red flag symptoms with back pain. Pt not interested in physical therapy. Gave handout for back stretches/exercises to do in the morning and night every day. Continue ibuprofen as needed for pain, up to  at a time if using regularly, take with food. OK to try  of flexeril at night when not driving and when it is ok for her to fall asleep.  Low back pain without sciatica, unspecified back pain laterality -     cyclobenzaprine (FLEXERIL) 5 MG tablet; Take 1 tablet (5 mg total) by mouth at bedtime.   Follow up plan: Return in about 2 months (around 06/17/2015).  Rex Kras, MD [facility] 04/17/2015, 3:34 PM

## 2015-06-21 ENCOUNTER — Encounter: Payer: Self-pay | Admitting: Pediatrics

## 2015-06-21 ENCOUNTER — Ambulatory Visit (INDEPENDENT_AMBULATORY_CARE_PROVIDER_SITE_OTHER): Payer: 59 | Admitting: Pediatrics

## 2015-06-21 VITALS — BP 129/86 | HR 73 | Temp 98.2°F | Ht 64.0 in | Wt 219.8 lb

## 2015-06-21 DIAGNOSIS — R1031 Right lower quadrant pain: Secondary | ICD-10-CM | POA: Diagnosis not present

## 2015-06-21 LAB — POCT URINALYSIS DIPSTICK
BILIRUBIN UA: NEGATIVE
Glucose, UA: NEGATIVE
Ketones, UA: NEGATIVE
Leukocytes, UA: NEGATIVE
Nitrite, UA: NEGATIVE
Protein, UA: NEGATIVE
SPEC GRAV UA: 1.025
Urobilinogen, UA: NEGATIVE
pH, UA: 5

## 2015-06-21 LAB — POCT UA - MICROSCOPIC ONLY
Bacteria, U Microscopic: NEGATIVE
CASTS, UR, LPF, POC: NEGATIVE
Crystals, Ur, HPF, POC: NEGATIVE
MUCUS UA: NEGATIVE
WBC, Ur, HPF, POC: NEGATIVE
Yeast, UA: NEGATIVE

## 2015-06-21 NOTE — Progress Notes (Signed)
    Subjective:    Patient ID: Leslie Abbott, female    DOB: 06-18-1966, 49 y.o.   MRN: [id number]  CC: abd pain  HPI: Leslie Abbott is a 49 y.o. female presenting on 06/21/2015 for Abdominal Pain  Sharp pain R groin/RLQ comes and goes for past 6 days. Sometimes notices when walking. Does not come on when sitting still Lasts for seconds, feels sharp/twinging Regular stooling Can keep walking through pain Never had anything like this before No dysuria No fevers Had a cystoloscopy with urology a few years ago for hematuria Regular stooling No dysparuenia, no new sexual partners, no abnormal vaginal discharge Periods irregular, menopausal  Relevant past medical, surgical, family and social history reviewed and updated as indicated. Interim medical history since our last visit reviewed. Allergies and medications reviewed and updated.   ROS: Per HPI unless specifically indicated above  Past Medical History Patient Active Problem List   Diagnosis Date Noted  . Obesity (BMI 30-39.9) 09/08/2013    Current Outpatient Prescriptions  Medication Sig Dispense Refill  . zolpidem (AMBIEN) 10 MG tablet Take 1 tablet (10 mg total) by mouth at bedtime as needed for sleep. 30 tablet 2  . cyclobenzaprine (FLEXERIL) 5 MG tablet Take 1 tablet (5 mg total) by mouth at bedtime. (Patient not taking: Reported on 06/21/2015) 30 tablet 0   No current facility-administered medications for this visit.       Objective:    BP 129/86 mmHg  Pulse 73  Temp(Src) 98.2 F (36.8 C) (Oral)  Ht 5\' 4"  (1.626 m)  Wt 219 lb 12.8 oz (99.701 kg)  BMI 37.71 kg/m2  Wt Readings from Last 3 Encounters:  06/21/15 219 lb 12.8 oz (99.701 kg)  04/17/15 211 lb 9.6 oz (95.981 kg)  03/14/15 216 lb 9.6 oz (98.249 kg)     Gen: NAD, alert, cooperative with exam, NCAT EYES: EOMI, no scleral injection or icterus ENT:  TMs pearly gray b/l, OP without erythema LYMPH: no cervical LAD CV: NRRR, normal S1/S2, no murmur, distal  pulses 2+ b/l Resp: CTABL, no wheezes, normal WOB Abd: +BS, soft, no tenderness including RLQ, neg psoas/obturator, ND no guarding or organomegaly, neg murphys Ext: No edema, warm Neuro: Alert and oriented     Assessment & Plan:   Leslie Abbott was seen today for abdominal pain. Sharp, lasts seconds. Benign exam. Works lifting things throughout the day. Pain happens only with certain movements possible that it is MSK in origin. Will check urine. Pt to take it easy over next few days, minimize lifting. Will check urine. Consider pelvic u/s if still ongoing next week.  Diagnoses and all orders for this visit:  Right lower quadrant abdominal pain -     POCT UA - Microscopic Only -     POCT urinalysis dipstick   Follow up plan: 2 weeks  Rex Krasarol Vincent, MD [facility] 06/21/2015, 2:03 PM

## 2015-06-22 NOTE — Progress Notes (Signed)
Patient aware.

## 2015-07-24 ENCOUNTER — Other Ambulatory Visit: Payer: Self-pay | Admitting: Nurse Practitioner

## 2015-07-25 NOTE — Telephone Encounter (Signed)
Lmtcb/ww 12/7

## 2015-07-25 NOTE — Telephone Encounter (Signed)
Last seen 06/21/15  Dr Oswaldo DoneVincent  [facility]

## 2015-07-25 NOTE — Telephone Encounter (Signed)
Needs to be seen for more ambien refills per new policy because it is a controlled substance, hasnt been seen for insomnia since 09/2014.

## 2015-08-07 ENCOUNTER — Encounter: Payer: Self-pay | Admitting: Pediatrics

## 2015-08-07 ENCOUNTER — Ambulatory Visit (INDEPENDENT_AMBULATORY_CARE_PROVIDER_SITE_OTHER): Payer: 59 | Admitting: Pediatrics

## 2015-08-07 VITALS — BP 133/88 | HR 80 | Temp 97.5°F | Ht 64.0 in | Wt 222.2 lb

## 2015-08-07 DIAGNOSIS — R450 Nervousness: Secondary | ICD-10-CM | POA: Diagnosis not present

## 2015-08-07 DIAGNOSIS — Z6838 Body mass index (BMI) 38.0-38.9, adult: Secondary | ICD-10-CM

## 2015-08-07 DIAGNOSIS — Z780 Asymptomatic menopausal state: Secondary | ICD-10-CM

## 2015-08-07 DIAGNOSIS — G4700 Insomnia, unspecified: Secondary | ICD-10-CM

## 2015-08-07 DIAGNOSIS — Z720 Tobacco use: Secondary | ICD-10-CM | POA: Insufficient documentation

## 2015-08-07 MED ORDER — ZOLPIDEM TARTRATE 10 MG PO TABS: 10.0000 mg | ORAL_TABLET | Freq: Every evening | ORAL | Status: DC | PRN

## 2015-08-07 NOTE — Progress Notes (Signed)
Subjective:    Patient ID: Leslie Abbott, female    DOB: 08-22-1965, 49 y.o.   MRN: [id number]  CC: Medication Refill   HPI: Leslie Abbott is a 49 y.o. female presenting for Medication Refill  Insomnia: has been taking ambien 2-3 nights a week for over 5 years. Sometimes thoughts at racing at night, keeps her awake. Has been on buspar in the past, didn't take it daily, doesn't think that it was helping Thinks her mood has been fine   Periods not regular anymore  BMI elevated Soda, carbs, pasta, sugar she says are her downfall   [id number] Feeling nervous, anxious or on edge 0 Not being able to stop or control worrying 0 Worrying too much about different things 0 Trouble relaxing 1 Being so restless that it is hard to sit still 3 Becoming easily annoyed or irritable 3 Feeling afraid as if something awful might happen 0        Total Score 7   Depression [facility] 08/07/2015 06/21/2015 04/17/2015 03/14/2015 09/27/2014  Decreased Interest 0 0 0 0 0  Down, Depressed, Hopeless 0 0 0 0 0  PHQ - 2 Score 0 0 0 0 0     Relevant past medical, surgical, family and social history reviewed and updated as indicated. Interim medical history since our last visit reviewed. Allergies and medications reviewed and updated.    ROS: Per HPI unless specifically indicated above  History  Smoking status  . Current Every Day Smoker -- 1.00 packs/day for 20 years  . Types: Cigarettes  Smokeless tobacco  . Not on file    Past Medical History Patient Active Problem List   Diagnosis Date Noted  . BMI 38.0-38.9,adult 08/07/2015  . Insomnia 08/07/2015  . Nervous 08/07/2015  . Obesity (BMI 30-39.9) 09/08/2013    Current Outpatient Prescriptions  Medication Sig Dispense Refill  . zolpidem (AMBIEN) 10 MG tablet Take 1 tablet (10 mg total) by mouth at bedtime as needed for sleep. 20 tablet 3   No current facility-administered medications for this visit.       Objective:    BP 133/88 mmHg   Pulse 80  Temp(Src) 97.5 F (36.4 C) (Oral)  Ht 5\' 4"  (1.626 m)  Wt 222 lb 3.2 oz (100.789 kg)  BMI 38.12 kg/m2  Wt Readings from Last 3 Encounters:  08/07/15 222 lb 3.2 oz (100.789 kg)  06/21/15 219 lb 12.8 oz (99.701 kg)  04/17/15 211 lb 9.6 oz (95.981 kg)     Gen: NAD, alert, cooperative with exam, NCAT EYES: EOMI, no scleral injection or icterus ENT:  OP without erythema LYMPH: no cervical LAD Neck: normal thyroid CV: NRRR, normal S1/S2, no murmur, distal pulses 2+ b/l Resp: CTABL, no wheezes, normal WOB Abd: +BS, soft, NTND. no guarding or organomegaly Ext: No edema, warm Neuro: Alert and oriented, strength equal b/l UE and LE, coordination grossly normal MSK: normal muscle bulk     Assessment & Plan:    Leslie Abbott was seen today for medication refill and f/u below med problems.  Diagnoses and all orders for this visit:  Insomnia Takes a couple nights a week. Continue, minimize use. -     zolpidem (AMBIEN) 10 MG tablet; Take 1 tablet (10 mg total) by mouth at bedtime as needed for sleep.  BMI 38.0-38.9,adult Discussed lifestyle changes, dietary changes and goals  Nervous Has been on buspar in the past. GAD 7 score 7 today. Will let me knwo if symptoms worsen, no  meds for now Check thyroid with labs she is getting  Menopause Rarely has hot flashes. Let me know if symptoms worsen   Follow up plan: 3 months  Rex Kras, MD [facility] 08/07/2015, 9:50 PM

## 2015-08-07 NOTE — Patient Instructions (Addendum)
Get thyroid checked with your physical (TSH)  Avoid sugary beverages such as sweet tea, juice Use small plates for meals Drink full glass of water with meals WAit 30 minutes after eating one plate before eating anymore if still hungry

## 2015-08-16 ENCOUNTER — Ambulatory Visit (INDEPENDENT_AMBULATORY_CARE_PROVIDER_SITE_OTHER): Payer: 59 | Admitting: Pediatrics

## 2015-08-16 ENCOUNTER — Encounter: Payer: Self-pay | Admitting: Pediatrics

## 2015-08-16 VITALS — BP 133/89 | HR 84 | Temp 97.0°F | Ht 64.0 in | Wt 221.8 lb

## 2015-08-16 DIAGNOSIS — L03012 Cellulitis of left finger: Secondary | ICD-10-CM

## 2015-08-16 MED ORDER — MUPIROCIN 2 % EX OINT: 1.0000 "application " | TOPICAL_OINTMENT | Freq: Two times a day (BID) | CUTANEOUS | Status: DC

## 2015-08-16 MED ORDER — SULFAMETHOXAZOLE-TRIMETHOPRIM 800-160 MG PO TABS: 1.0000 | ORAL_TABLET | Freq: Two times a day (BID) | ORAL | Status: DC

## 2015-08-16 NOTE — Patient Instructions (Signed)
aspercream with lidocaine  Ana creme with lidocaine

## 2015-08-16 NOTE — Progress Notes (Signed)
    Subjective:    Patient ID: Leslie Abbott, female    DOB: 05/29/1966, 49 y.o.   MRN: [id number]  CC: Swelling Left Thumb   HPI: Leslie Abbott is a 49 y.o. female presenting for Swelling Left Thumb  Started several days ago. Thumb tender, was more tender last night She doesn't think it has had any discharge, slightly crusty this morning Felt more swollen last night than today No fevers Able to use hand and thumb normally Moves surrounding joints fine Bites her finger nails/hang nails regularly Has been soaking thumb in epson salts  Relevant past medical, surgical, family and social history reviewed and updated as indicated. Interim medical history since our last visit reviewed. Allergies and medications reviewed and updated.   ROS: Per HPI unless specifically indicated above  History  Smoking status  . Current Every Day Smoker -- 1.00 packs/day for 20 years  . Types: Cigarettes  Smokeless tobacco  . Not on file    Past Medical History Patient Active Problem List   Diagnosis Date Noted  . BMI 38.0-38.9,adult 08/07/2015  . Insomnia 08/07/2015  . Nervous 08/07/2015  . Tobacco abuse 08/07/2015  . Obesity (BMI 30-39.9) 09/08/2013        Objective:    BP 133/89 mmHg  Pulse 84  Temp(Src) 97 F (36.1 C) (Oral)  Ht 5\' 4"  (1.626 m)  Wt 221 lb 12.8 oz (100.608 kg)  BMI 38.05 kg/m2  Wt Readings from Last 3 Encounters:  08/16/15 221 lb 12.8 oz (100.608 kg)  08/07/15 222 lb 3.2 oz (100.789 kg)  06/21/15 219 lb 12.8 oz (99.701 kg)     Gen: NAD, alert, cooperative with exam, NCAT EYES: EOMI, no scleral injection or icterus ENT:  MMM CV: WWP Resp: normal WOB Ext: No edema, warm Neuro: Alert and oriented Skin: L thumb lateral side of nail with some redness, swelling. Mild tenderness with pressure to side of nail. No overt fluctuance. Able to lift nail from side of nail bed appropriately. With gentle scraping of callous at side of nail with scapel blade has minimal return  of opaque grey/green purulence     Assessment & Plan:    Leslie Abbott was seen today for swelling left thumb, found to have paronychia. Draining now, will not do further I&D at this time. Will treat with PO antibiotics as below, continue soaking in warm water/warm compresses three times a day, apply mupirocin after soaking. Discussed no more nail biting.  Diagnoses and all orders for this visit:  Paronychia, left -     mupirocin ointment (BACTROBAN) 2 %; Place 1 application into the nose 2 (two) times daily. -     sulfamethoxazole-trimethoprim (BACTRIM DS) 800-160 MG tablet; Take 1 tablet by mouth 2 (two) times daily.   Follow up plan: Return if symptoms worsen or fail to improve.  Rex Krasarol Vincent, MD [facility] 08/16/2015, 12:45 PM

## 2015-08-17 ENCOUNTER — Telehealth: Payer: Self-pay | Admitting: Pediatrics

## 2015-08-17 DIAGNOSIS — L03012 Cellulitis of left finger: Secondary | ICD-10-CM

## 2015-08-17 MED ORDER — AMOXICILLIN-POT CLAVULANATE 875-125 MG PO TABS
1.0000 | ORAL_TABLET | Freq: Two times a day (BID) | ORAL | Status: DC
Start: 2015-08-17 — End: 2015-11-27

## 2015-08-17 NOTE — Telephone Encounter (Signed)
Patient aware and understanding.  

## 2015-08-17 NOTE — Telephone Encounter (Signed)
Please advise 

## 2015-08-17 NOTE — Telephone Encounter (Signed)
Pt should stop the bactrim. Start augmentin, take one in morning, one at night for 10 days. I sent in the new Rx.

## 2015-11-27 ENCOUNTER — Encounter: Payer: Self-pay | Admitting: Nurse Practitioner

## 2015-11-27 ENCOUNTER — Ambulatory Visit (INDEPENDENT_AMBULATORY_CARE_PROVIDER_SITE_OTHER): Payer: 59 | Admitting: Nurse Practitioner

## 2015-11-27 VITALS — BP 127/77 | HR 82 | Temp 97.3°F | Ht 64.0 in | Wt 220.0 lb

## 2015-11-27 DIAGNOSIS — F329 Major depressive disorder, single episode, unspecified: Secondary | ICD-10-CM | POA: Diagnosis not present

## 2015-11-27 DIAGNOSIS — F411 Generalized anxiety disorder: Secondary | ICD-10-CM

## 2015-11-27 DIAGNOSIS — F32A Depression, unspecified: Secondary | ICD-10-CM

## 2015-11-27 MED ORDER — ESCITALOPRAM OXALATE 10 MG PO TABS: 10.0000 mg | ORAL_TABLET | Freq: Every day | ORAL | Status: DC

## 2015-11-27 MED ORDER — BUSPIRONE HCL 10 MG PO TABS: 10.0000 mg | ORAL_TABLET | Freq: Three times a day (TID) | ORAL | Status: DC

## 2015-11-27 NOTE — Progress Notes (Signed)
   Subjective:    Patient ID: Leslie BoughRobin Abbott, female    DOB: [birth year], 50 y.o.   MRN: [id number]  HPI Patient in to discuss anxiety- Has been going on for over a year- says she works 12 hour shifts and that is very stressful. York SpanielSaid that she stays angry a lot- gets upset very easily. SHe was on buspar in the past which helped. Experiencing tension in neck and shoulders when she is stressed.  [facility] 11/27/2015 08/16/2015 08/07/2015 06/21/2015 04/17/2015  Decreased Interest 3 0 0 0 0  Down, Depressed, Hopeless 2 0 0 0 0  PHQ - 2 Score 5 0 0 0 0  Altered sleeping 2 - - - -  Tired, decreased energy 3 - - - -  Change in appetite 0 - - - -  Feeling bad or failure about yourself  1 - - - -  Trouble concentrating 3 - - - -  Moving slowly or fidgety/restless 3 - - - -  Suicidal thoughts 0 - - - -  PHQ-9 Score 17 - - - -  Difficult doing work/chores Very difficult - - - -    GAD 7 : Generalized Anxiety Score 11/27/2015  Nervous, Anxious, on Edge 3  Control/stop worrying 3  Worry too much - different things 3  Trouble relaxing 3  Restless 3  Easily annoyed or irritable 3  Afraid - awful might happen 1  Total GAD 7 Score 19  Anxiety Difficulty Very difficult       Review of Systems  Constitutional: Negative.   Respiratory: Negative.   Cardiovascular: Negative.   Genitourinary: Negative.   Neurological: Negative.   Psychiatric/Behavioral: Negative.   All other systems reviewed and are negative.      Objective:   Physical Exam  Constitutional: She is oriented to person, place, and time. She appears well-developed and well-nourished. No distress.  Cardiovascular: Normal rate, regular rhythm and normal heart sounds.   Pulmonary/Chest: Effort normal and breath sounds normal.  Neurological: She is alert and oriented to person, place, and time.  Skin: Skin is warm.  Psychiatric: She has a normal mood and affect. Her behavior is normal. Judgment and thought content normal.   Very talkative Good eye contact Answers all questions appropriately    BP 127/77 mmHg  Pulse 82  Temp(Src) 97.3 F (36.3 C) (Oral)  Ht 5\' 4"  (1.626 m)  Wt 220 lb (99.791 kg)  BMI 37.74 kg/m2       Assessment & Plan:  1. Depression - escitalopram (LEXAPRO) 10 MG tablet; Take 1 tablet (10 mg total) by mouth daily.  Dispense: 30 tablet; Refill: 5  2. GAD (generalized anxiety disorder) - busPIRone (BUSPAR) 10 MG tablet; Take 1 tablet (10 mg total) by mouth 3 (three) times daily.  Dispense: 30 tablet; Refill: 1  Stress management Eat 3 meals a day Exercise Follow up in 1 month  Mary-Margaret Daphine DeutscherMartin, FNP

## 2015-11-27 NOTE — Patient Instructions (Signed)
Stress and Stress Management Stress is a normal reaction to life events. It is what you feel when life demands more than you are used to or more than you can handle. Some stress can be useful. For example, the stress reaction can help you catch the last bus of the day, study for a test, or meet a deadline at work. But stress that occurs too often or for too long can cause problems. It can affect your emotional health and interfere with relationships and normal daily activities. Too much stress can weaken your immune system and increase your risk for physical illness. If you already have a medical problem, stress can make it worse. CAUSES  All sorts of life events may cause stress. An event that causes stress for one person may not be stressful for another person. Major life events commonly cause stress. These may be positive or negative. Examples include losing your job, moving into a new home, getting married, having a baby, or losing a loved one. Less obvious life events may also cause stress, especially if they occur day after day or in combination. Examples include working long hours, driving in traffic, caring for children, being in debt, or being in a difficult relationship. SIGNS AND SYMPTOMS Stress may cause emotional symptoms including, the following:  Anxiety. This is feeling worried, afraid, on edge, overwhelmed, or out of control.  Anger. This is feeling irritated or impatient.  Depression. This is feeling sad, down, helpless, or guilty.  Difficulty focusing, remembering, or making decisions. Stress may cause physical symptoms, including the following:   Aches and pains. These may affect your head, neck, back, stomach, or other areas of your body.  Tight muscles or clenched jaw.  Low energy or trouble sleeping. Stress may cause unhealthy behaviors, including the following:   Eating to feel better (overeating) or skipping meals.  Sleeping too little, too much, or both.  Working  too much or putting off tasks (procrastination).  Smoking, drinking alcohol, or using drugs to feel better. DIAGNOSIS  Stress is diagnosed through an assessment by your health care provider. Your health care provider will ask questions about your symptoms and any stressful life events.Your health care provider will also ask about your medical history and may order blood tests or other tests. Certain medical conditions and medicine can cause physical symptoms similar to stress. Mental illness can cause emotional symptoms and unhealthy behaviors similar to stress. Your health care provider may refer you to a mental health professional for further evaluation.  TREATMENT  Stress management is the recommended treatment for stress.The goals of stress management are reducing stressful life events and coping with stress in healthy ways.  Techniques for reducing stressful life events include the following:  Stress identification. Self-monitor for stress and identify what causes stress for you. These skills may help you to avoid some stressful events.  Time management. Set your priorities, keep a calendar of events, and learn to say "no." These tools can help you avoid making too many commitments. Techniques for coping with stress include the following:  Rethinking the problem. Try to think realistically about stressful events rather than ignoring them or overreacting. Try to find the positives in a stressful situation rather than focusing on the negatives.  Exercise. Physical exercise can release both physical and emotional tension. The key is to find a form of exercise you enjoy and do it regularly.  Relaxation techniques. These relax the body and mind. Examples include yoga, meditation, tai chi, biofeedback, deep  breathing, progressive muscle relaxation, listening to music, being out in nature, journaling, and other hobbies. Again, the key is to find one or more that you enjoy and can do  regularly.  Healthy lifestyle. Eat a balanced diet, get plenty of sleep, and do not smoke. Avoid using alcohol or drugs to relax.  Strong support network. Spend time with family, friends, or other people you enjoy being around.Express your feelings and talk things over with someone you trust. Counseling or talktherapy with a mental health professional may be helpful if you are having difficulty managing stress on your own. Medicine is typically not recommended for the treatment of stress.Talk to your health care provider if you think you need medicine for symptoms of stress. HOME CARE INSTRUCTIONS  Keep all follow-up visits as directed by your health care provider.  Take all medicines as directed by your health care provider. SEEK MEDICAL CARE IF:  Your symptoms get worse or you start having new symptoms.  You feel overwhelmed by your problems and can no longer manage them on your own. SEEK IMMEDIATE MEDICAL CARE IF:  You feel like hurting yourself or someone else.   This information is not intended to replace advice given to you by your health care provider. Make sure you discuss any questions you have with your health care provider.   Document Released: 01/28/2001 Document Revised: 08/25/2014 Document Reviewed: 03/29/2013 Elsevier Interactive Patient Education 2016 [employer]  

## 2015-12-20 ENCOUNTER — Other Ambulatory Visit: Payer: Self-pay | Admitting: Obstetrics and Gynecology

## 2015-12-20 ENCOUNTER — Ambulatory Visit
Admission: RE | Admit: 2015-12-20 | Discharge: 2015-12-20 | Disposition: A | Discharge status: Home / Self Care | Payer: 59 | Source: Ambulatory Visit | Attending: Obstetrics and Gynecology | Admitting: Obstetrics and Gynecology

## 2015-12-20 DIAGNOSIS — R062 Wheezing: Secondary | ICD-10-CM

## 2016-04-09 ENCOUNTER — Other Ambulatory Visit: Payer: Self-pay | Admitting: Pediatrics

## 2016-04-09 DIAGNOSIS — G4700 Insomnia, unspecified: Secondary | ICD-10-CM

## 2016-04-10 NOTE — Telephone Encounter (Signed)
Last filled 01/30/16, last seen by you 08/07/15. Rout to  Pool to call in

## 2016-04-10 NOTE — Telephone Encounter (Signed)
Needs to be seen every 6 mo for this kind of med refill

## 2016-04-10 NOTE — Telephone Encounter (Signed)
Left mssg refilled unable to be done NTBS

## 2016-04-15 ENCOUNTER — Encounter: Payer: Self-pay | Admitting: Pediatrics

## 2016-04-15 ENCOUNTER — Ambulatory Visit (INDEPENDENT_AMBULATORY_CARE_PROVIDER_SITE_OTHER): Payer: 59 | Admitting: Pediatrics

## 2016-04-15 VITALS — BP 116/77 | HR 68 | Temp 97.8°F | Ht 64.0 in | Wt 203.6 lb

## 2016-04-15 DIAGNOSIS — G4700 Insomnia, unspecified: Secondary | ICD-10-CM | POA: Diagnosis not present

## 2016-04-15 DIAGNOSIS — R450 Nervousness: Secondary | ICD-10-CM | POA: Diagnosis not present

## 2016-04-15 DIAGNOSIS — E669 Obesity, unspecified: Secondary | ICD-10-CM

## 2016-04-15 DIAGNOSIS — Z720 Tobacco use: Secondary | ICD-10-CM

## 2016-04-15 DIAGNOSIS — F411 Generalized anxiety disorder: Secondary | ICD-10-CM | POA: Diagnosis not present

## 2016-04-15 MED ORDER — BUSPIRONE HCL 10 MG PO TABS: 10.0000 mg | ORAL_TABLET | Freq: Three times a day (TID) | ORAL | 1 refills | Status: DC

## 2016-04-15 MED ORDER — ZOLPIDEM TARTRATE 10 MG PO TABS: 10.0000 mg | ORAL_TABLET | Freq: Every evening | ORAL | 4 refills | Status: DC | PRN

## 2016-04-15 NOTE — Progress Notes (Signed)
  Subjective:   Patient ID: Leslie Abbott, female    DOB: [birth year], 50 y.o.   MRN: [id number] CC: Medication Refill  HPI: Leslie Abbott is a 50 y.o. female presenting for Medication Refill  Weight loss: Low carb Cutting back sugar Minimal exercise Diet soda  Loose stools: Never wakes her up at night Has apprx 1 now loose stool since she switched her diet to healthier foods daily No blood in stools  Insomnia: Taking ambien when she is working Working 12 hrs, 3-4 times a week Working days  Anxiety:  Takes buspar as needed Takes lexapro daily Thinks that anxiety is fairly well controlled  Relevant past medical, surgical, family and social history reviewed. Allergies and medications reviewed and updated. History  Smoking Status  . Current Every Day Smoker  . Packs/day: 1.00  . Years: 20.00  . Types: Cigarettes  Smokeless Tobacco  . Never Used   ROS: Per HPI   Objective:    BP 116/77   Pulse 68   Temp 97.8 F (36.6 C) (Oral)   Ht 5\' 4"  (1.626 m)   Wt 203 lb 9.6 oz (92.4 kg)   BMI 34.95 kg/m   Wt Readings from Last 3 Encounters:  04/15/16 203 lb 9.6 oz (92.4 kg)  11/27/15 220 lb (99.8 kg)  08/16/15 221 lb 12.8 oz (100.6 kg)    Gen: NAD, alert, cooperative with exam, NCAT EYES: EOMI, no conjunctival injection, or no icterus ENT:  TMs pearly gray b/l, OP without erythema LYMPH: no cervical LAD CV: NRRR, normal S1/S2, no murmur, distal pulses 2+ b/l Resp: CTABL, no wheezes, normal WOB Abd: +BS, soft, NTND. no guarding or organomegaly Ext: No edema, warm Neuro: Alert and oriented  Assessment & Plan:  Leslie Abbott was seen today for medication refill.  Diagnoses and all orders for this visit:  Nervous Improving control,  cont lexapro, buspar  Insomnia Takes ambien a few times a week Not every day #20 tabs for a month -     zolpidem (AMBIEN) 10 MG tablet; Take 1 tablet (10 mg total) by mouth at bedtime as needed for sleep.  GAD (generalized anxiety disorder) -      busPIRone (BUSPAR) 10 MG tablet; Take 1 tablet (10 mg total) by mouth 3 (three) times daily.  Tobacco abuse Cont to encourage cessation Now on vaping primarily  Obesity (BMI 30-39.9) Making changes to diet, decreased carbs, increased veg Cont lifestyle changes Minimal physical activity, cont to work on increasing  Follow up plan: 6 mo Rex Krasarol Vincent, [facility]

## 2016-07-07 ENCOUNTER — Encounter: Payer: Self-pay | Admitting: Pediatrics

## 2016-07-07 ENCOUNTER — Ambulatory Visit (INDEPENDENT_AMBULATORY_CARE_PROVIDER_SITE_OTHER): Payer: 59 | Admitting: Pediatrics

## 2016-07-07 VITALS — BP 123/80 | HR 64 | Temp 97.2°F | Ht 64.0 in | Wt 200.2 lb

## 2016-07-07 DIAGNOSIS — Z720 Tobacco use: Secondary | ICD-10-CM

## 2016-07-07 DIAGNOSIS — I8390 Asymptomatic varicose veins of unspecified lower extremity: Secondary | ICD-10-CM | POA: Diagnosis not present

## 2016-07-07 NOTE — Progress Notes (Signed)
  Subjective:   Patient ID: Leslie Abbott, female    DOB: [birth year], 50 y.o.   MRN: [id number] CC: Varicose Veins  HPI: Leslie Abbott is a 50 y.o. female presenting for Varicose Veins Has been there for a years  Stands throughout much of the day She thinks it has been getting worse over past few years Goes up R leg to thigh No redness, no tenderness, no soreness, no swelling No history of blood clots  Has been vaping nicotine, from 18mg , down to 3mg   Relevant past medical, surgical, family and social history reviewed. Allergies and medications reviewed and updated. History  Smoking Status  . Current Every Day Smoker  . Packs/day: 1.00  . Years: 20.00  . Types: Cigarettes  Smokeless Tobacco  . Never Used   ROS: Per HPI   Objective:    BP 123/80   Pulse 64   Temp 97.2 F (36.2 C) (Oral)   Ht 5\' 4"  (1.626 m)   Wt 200 lb 3.2 oz (90.8 kg)   BMI 34.36 kg/m   Wt Readings from Last 3 Encounters:  07/07/16 200 lb 3.2 oz (90.8 kg)  04/15/16 203 lb 9.6 oz (92.4 kg)  11/27/15 220 lb (99.8 kg)    Gen: NAD, alert, cooperative with exam, NCAT EYES: EOMI, no conjunctival injection, or no icterus CV: NRRR, normal S1/S2 Resp: CTABL, no wheezes, normal WOB Ext: No edema, warm Neuro: Alert and oriented Skin: varicose vein visible lateral side of lower leg running proximally to mid thigh, crosses anterior mid thigh. No redness, no swelling, no TTP  Assessment & Plan:  Leslie BallRobin was seen today for varicose veins.  Diagnoses and all orders for this visit:  Varicose vein of leg No inflammation Trial compression hose, Rx given  Tobacco abuse Continuing to minimize nicotine intake, still primarily using vape, decreasing nicotine amount Cont to encourage cessation   Follow up plan: 6 mo, sooner as needed Leslie Krasarol Crystallee Werden, [facility]

## 2016-09-01 ENCOUNTER — Encounter: Payer: Self-pay | Admitting: Family Medicine

## 2016-09-01 ENCOUNTER — Ambulatory Visit (INDEPENDENT_AMBULATORY_CARE_PROVIDER_SITE_OTHER): Payer: 59 | Admitting: Family Medicine

## 2016-09-01 VITALS — BP 116/75 | HR 100 | Temp 97.2°F | Ht 64.0 in | Wt 208.0 lb

## 2016-09-01 DIAGNOSIS — R6889 Other general symptoms and signs: Secondary | ICD-10-CM | POA: Diagnosis not present

## 2016-09-01 DIAGNOSIS — J069 Acute upper respiratory infection, unspecified: Secondary | ICD-10-CM

## 2016-09-01 LAB — VERITOR FLU A/B WAIVED
Influenza A: NEGATIVE
Influenza B: NEGATIVE

## 2016-09-01 NOTE — Progress Notes (Signed)
   Subjective:  Patient ID: Leslie Abbott, female    DOB: 09-18-1965  Age: 51 y.o. MRN: [id number]  CC: Nasal Congestion (pt here today c/o stuffy nose and head cold x 1 week)   HPI Leslie Abbott presents for  Patient presents with dry cough runny stuffy nose. Diffuse headache of moderate intensity. Patient also has chills and subjective fever. Denies Body aches. Has sapped the energy.worsening and seems to have become more significant starting 2 days ago.   History Leslie Abbott has no past medical history on file.   She has a past surgical history that includes Tubal ligation.   Her family history includes Cancer in her mother; Heart disease in her father.She reports that she has been smoking Cigarettes.  She has a 20.00 pack-year smoking history. She has never used smokeless tobacco. She reports that she drinks about 1.8 oz of alcohol per week . She reports that she does not use drugs.  Current Outpatient Prescriptions on File Prior to Visit  Medication Sig Dispense Refill  . busPIRone (BUSPAR) 10 MG tablet Take 1 tablet (10 mg total) by mouth 3 (three) times daily. 30 tablet 1  . escitalopram (LEXAPRO) 10 MG tablet Take 1 tablet (10 mg total) by mouth daily. 30 tablet 5  . zolpidem (AMBIEN) 10 MG tablet Take 1 tablet (10 mg total) by mouth at bedtime as needed for sleep. 20 tablet 4   No current facility-administered medications on file prior to visit.     ROS Review of Systems  Constitutional: Positive for appetite change, chills and fever.  HENT: Positive for congestion and rhinorrhea. Negative for ear pain, nosebleeds, postnasal drip, sinus pressure and sore throat.   Respiratory: Negative for chest tightness and shortness of breath.   Cardiovascular: Negative for chest pain.  Musculoskeletal: Negative for myalgias.  Skin: Negative for rash.    Objective:  BP 116/75   Pulse 100   Temp 97.2 F (36.2 C) (Oral)   Ht 5\' 4"  (1.626 m)   Wt 208 lb (94.3 kg)   BMI 35.70 kg/m   Physical  Exam  Constitutional: She is oriented to person, place, and time. She appears well-developed and well-nourished. No distress.  Cardiovascular: Normal rate and regular rhythm.   Pulmonary/Chest: Breath sounds normal.  Neurological: She is alert and oriented to person, place, and time.  Skin: Skin is warm and dry.  Psychiatric: She has a normal mood and affect.    Assessment & Plan:   Leslie Abbott was seen today for nasal congestion.  Diagnoses and all orders for this visit:  Influenza-like symptoms -     Veritor Flu A/B Waived  Acute upper respiratory infection   I am having Ms. Abbott maintain her escitalopram, zolpidem, and busPIRone.  No orders of the defined types were placed in this encounter.  Flu result negative.  Follow-up: No Follow-up on file.  Mechele ClaudeWarren Caitland Porchia, M.D.

## 2016-09-01 NOTE — Patient Instructions (Signed)
Mucinex D in the morning and NyQuil in the evening should help control your symptoms. Follow up if there is any worsening of your symptoms such as increased cough, fever or productivity from either blowing her nose or the cough.  It was a pleasure to see you today. Hope you better soon.  Best regards, Mechele ClaudeWarren Leonetta Mcgivern M.D.

## 2016-10-07 IMAGING — CR DG CHEST 2V
2 series · 2 of 2 positions shown · non-contrast
Comparison: None.

CLINICAL DATA: Wheezing on physical exam, history of smoking

EXAM:
CHEST  2 VIEW

[w chest pa]
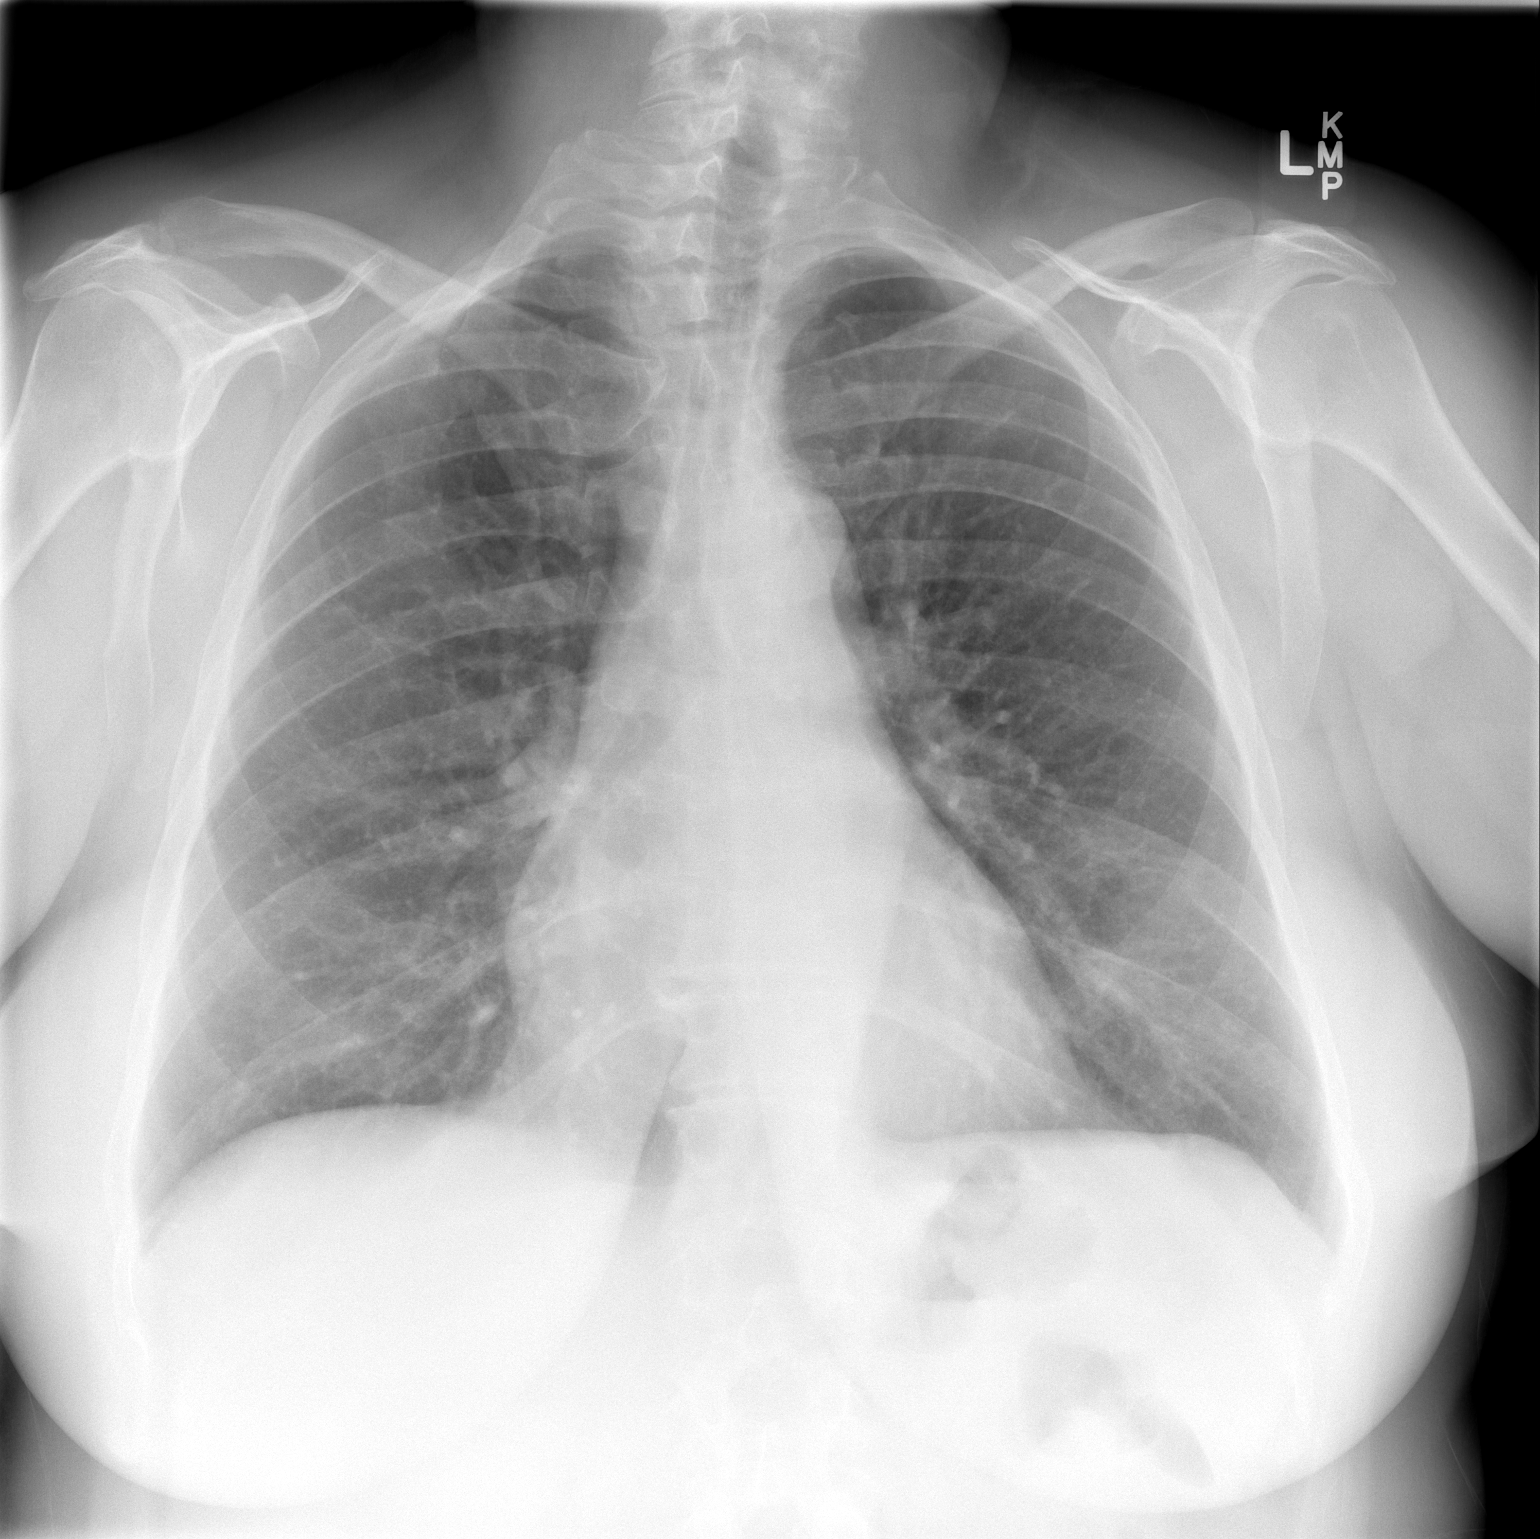

[w chest lat]
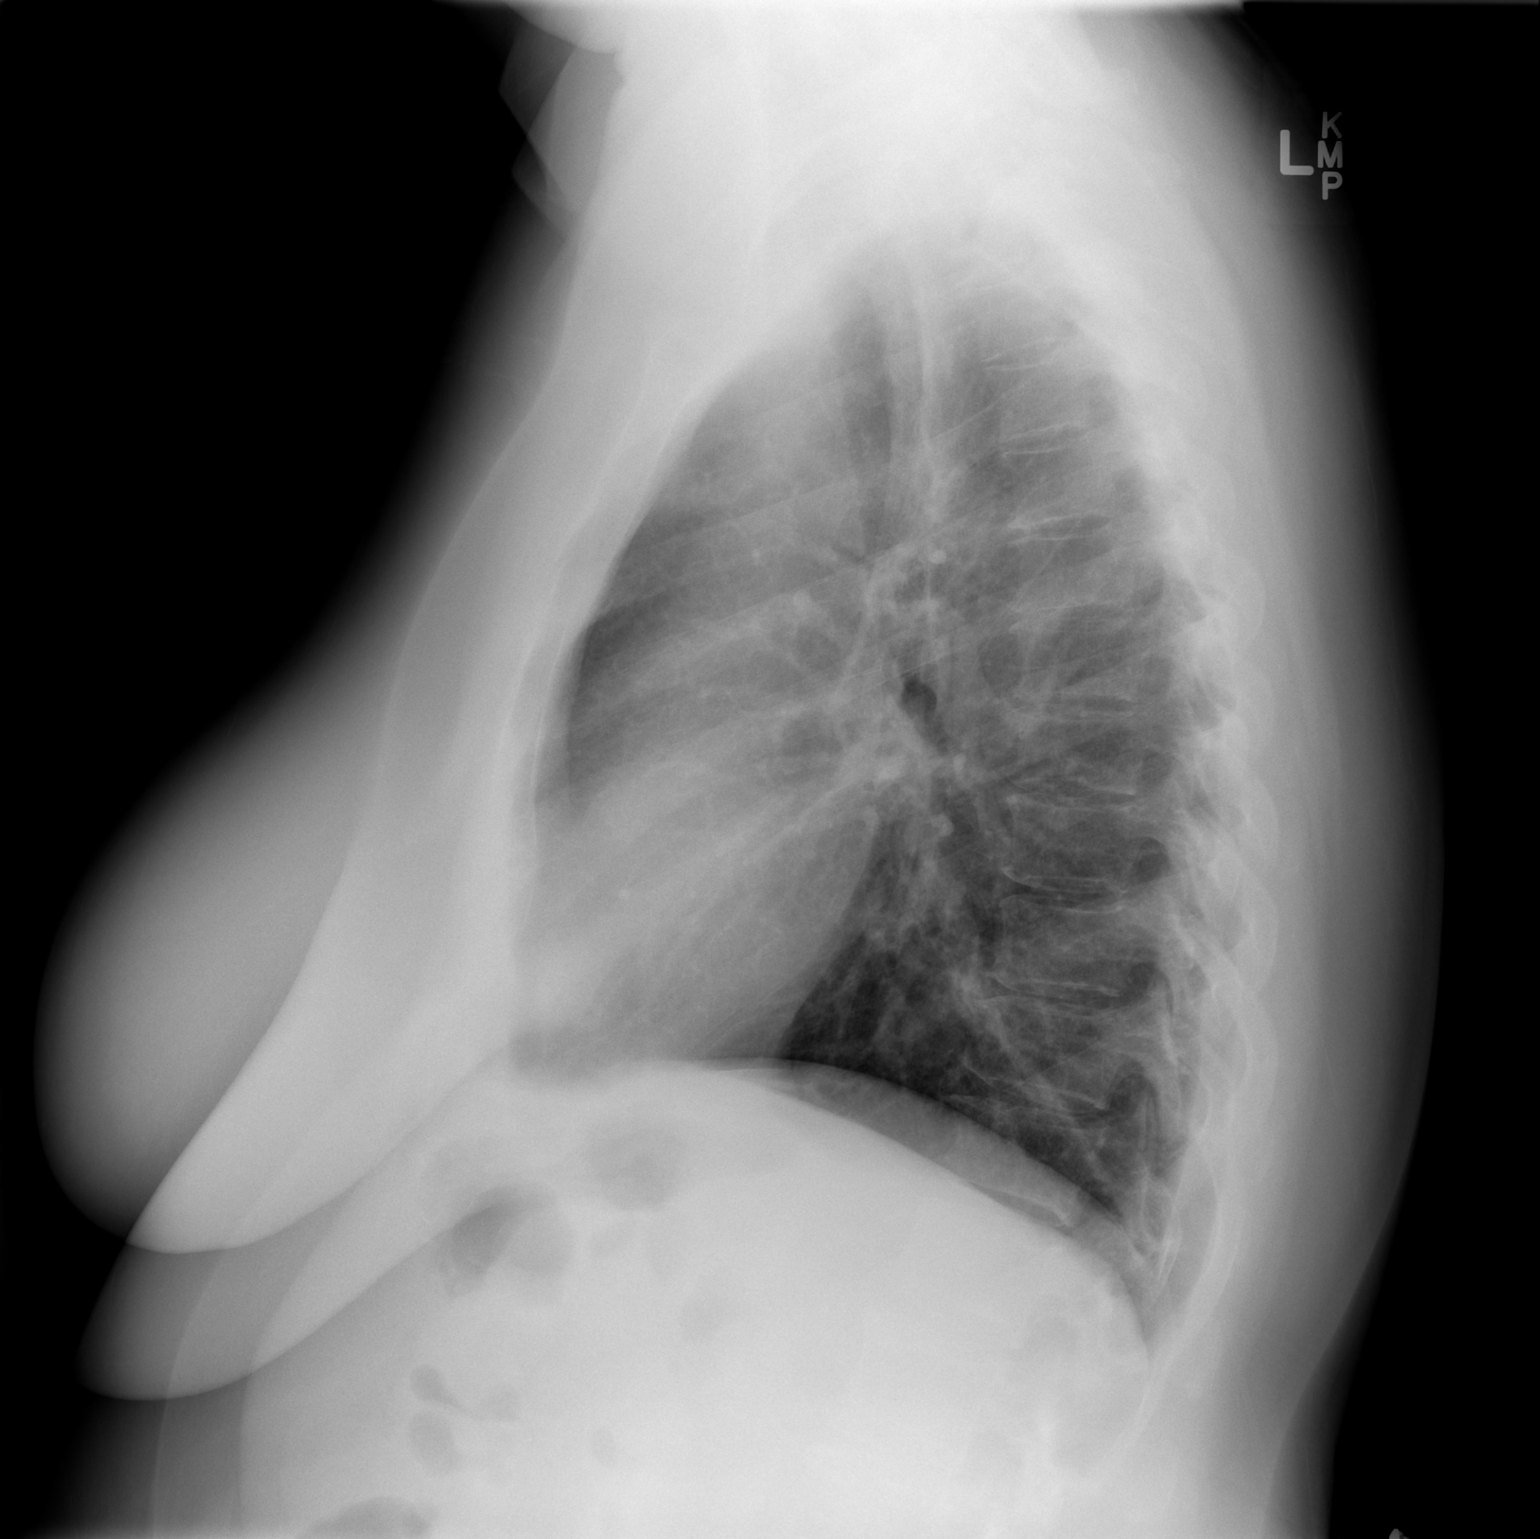

[2 of 2 positions shown; findings below may reference images not displayed]

FINDINGS: No active infiltrate or effusion is seen. Mediastinal and hilar
contours are unremarkable. There is a small area of somewhat
increased opacity anteriorly overlying the heart shadow on the
lateral view only. This most likely represents overlapping
costochondral junctions and is of doubtful significance. Attention
to this area on followup chest x-ray is recommended however. The
heart is within normal limits in size. No bony abnormality is seen.
IMPRESSION: No active cardiopulmonary disease.  See above.

## 2016-11-06 ENCOUNTER — Other Ambulatory Visit: Payer: Self-pay | Admitting: Pediatrics

## 2016-11-06 DIAGNOSIS — G4700 Insomnia, unspecified: Secondary | ICD-10-CM

## 2016-11-06 NOTE — Telephone Encounter (Signed)
Last filled 10/04/16. Route to pool for call in

## 2016-11-10 ENCOUNTER — Other Ambulatory Visit: Payer: Self-pay | Admitting: Pediatrics

## 2016-11-10 ENCOUNTER — Encounter: Payer: Self-pay | Admitting: Pediatrics

## 2016-11-10 ENCOUNTER — Ambulatory Visit (INDEPENDENT_AMBULATORY_CARE_PROVIDER_SITE_OTHER): Payer: 59 | Admitting: Pediatrics

## 2016-11-10 VITALS — BP 122/81 | HR 73 | Temp 98.1°F | Ht 64.0 in | Wt 205.2 lb

## 2016-11-10 DIAGNOSIS — E785 Hyperlipidemia, unspecified: Secondary | ICD-10-CM | POA: Insufficient documentation

## 2016-11-10 DIAGNOSIS — E669 Obesity, unspecified: Secondary | ICD-10-CM | POA: Diagnosis not present

## 2016-11-10 DIAGNOSIS — E66812 Obesity, class 2: Secondary | ICD-10-CM | POA: Insufficient documentation

## 2016-11-10 DIAGNOSIS — Z720 Tobacco use: Secondary | ICD-10-CM | POA: Diagnosis not present

## 2016-11-10 DIAGNOSIS — E559 Vitamin D deficiency, unspecified: Secondary | ICD-10-CM

## 2016-11-10 DIAGNOSIS — G4700 Insomnia, unspecified: Secondary | ICD-10-CM | POA: Diagnosis not present

## 2016-11-10 DIAGNOSIS — R7303 Prediabetes: Secondary | ICD-10-CM | POA: Insufficient documentation

## 2016-11-10 DIAGNOSIS — F411 Generalized anxiety disorder: Secondary | ICD-10-CM | POA: Diagnosis not present

## 2016-11-10 MED ORDER — BUSPIRONE HCL 10 MG PO TABS: 10.0000 mg | ORAL_TABLET | Freq: Two times a day (BID) | ORAL | 4 refills | Status: DC

## 2016-11-10 MED ORDER — ZOLPIDEM TARTRATE 10 MG PO TABS: 10.0000 mg | ORAL_TABLET | Freq: Every evening | ORAL | 5 refills | Status: DC | PRN

## 2016-11-10 MED ORDER — ESCITALOPRAM OXALATE 10 MG PO TABS: 10.0000 mg | ORAL_TABLET | Freq: Every day | ORAL | 5 refills | Status: DC

## 2016-11-10 NOTE — Progress Notes (Signed)
Subjective:   Patient ID: Leslie Abbott, female    DOB: [birth year], 52 y.o.   MRN: [id number] CC: Medication Refill  HPI: [name] is a 51 y.o. female presenting for Medication Refill  Insomnia: taking ambien when she works, apprx 3-4 days a week, not taking it other days Tosses and turns more other days Has been trying to go to bed at the same time hasnt tried any other otc med such as melatonin  Anxiety: ongoing symptoms Self-discontinued lexapro and buspar Since then has had more trouble sleeping, feeling irritable Worrying all the time, not about anything in particular Mood has been fine No thoughts of self harm  Elevated BMI: not exercising regularly Stays active at work  Tobacco: quit cigarettes in 01/2016, now vaping, down to 3mg  nicotine a day Plans to continue decreasing  Due for screening colonoscopy, pt declines Has appt today for mammogram, pap smear with gynecology  Vit D def: not taking anything OTC now  Relevant past medical, surgical, family and social history reviewed. Allergies and medications reviewed and updated. History  Smoking Status  . Former Smoker  . Packs/day: 1.00  . Years: 20.00  . Types: Cigarettes  Smokeless Tobacco  . Current User   ROS: Per HPI   Objective:    BP 122/81   Pulse 73   Temp 98.1 F (36.7 C) (Oral)   Ht 5\' 4"  (1.626 m)   Wt 205 lb 3.2 oz (93.1 kg)   BMI 35.22 kg/m   Wt Readings from Last 3 Encounters:  11/10/16 205 lb 3.2 oz (93.1 kg)  09/01/16 208 lb (94.3 kg)  07/07/16 200 lb 3.2 oz (90.8 kg)    Gen: NAD, alert, cooperative with exam, NCAT EYES: EOMI, no conjunctival injection, or no icterus ENT:  TMs pearly gray with clear effusion b/l, OP without erythema LYMPH: no cervical LAD CV: NRRR, normal S1/S2, no murmur, distal pulses 2+ b/l Resp: CTABL, no wheezes, normal WOB Ext: No edema, warm Neuro: Alert and oriented, strength equal b/l UE and LE, coordination grossly normal MSK: normal muscle bulk Psych:  nl affect, tearful at times  Assessment & Plan:  Tyan was seen today for medication refill and med problem f/u.  Diagnoses and all orders for this visit:  Insomnia, unspecified type Controlled on nights she takes [place] Still with restless on nights she doesnt [employer] 3-4 nights a week, not every night Discussed melatonin use, diphenhydramine on alternate nights, sleep hygiene, no naps, avoiding caffeine, increasing daily exercise on days off Improved symptoms when anxiety better controlled -     zolpidem (AMBIEN) 10 MG tablet; Take 1 tablet (10 mg total) by mouth at bedtime as needed for sleep.  GAD (generalized anxiety disorder) Uncontrolled, daily symptoms Restart antianxiety medications, start half a tab for a week first Mood has been fine, feels safe at home rec counseling, pt declines at this time If not improving, let me know -     busPIRone (BUSPAR) 10 MG tablet; Take 1 tablet (10 mg total) by mouth 2 (two) times daily. -     escitalopram (LEXAPRO) 10 MG tablet; Take 1 tablet (10 mg total) by mouth daily.  Obesity, Class II, BMI 35-39.9 Cont lifestyle changes, increasing activity, limiting sugary foods -     TSH; Future -     Basic Metabolic Panel; Future  Tobacco abuse vaping nicotine now, planning to cont to decrease  Hyperlipidemia, unspecified hyperlipidemia type Not on medication now -     Lipid panel;  Future  Vitamin D deficiency Not taking supplements now -     VITAMIN D 25 Hydroxy (Vit-D Deficiency, Fractures); Future  Prediabetes Last a1c 5.7 -     Hemoglobin A1c; Future  Colon Ca screen Due Pt declines both scope and FIT at this time  Follow up plan: 6 mo Rex Krasarol Trajan [place], MD [facility]

## 2016-11-20 ENCOUNTER — Ambulatory Visit (INDEPENDENT_AMBULATORY_CARE_PROVIDER_SITE_OTHER): Payer: 59 | Admitting: Pediatrics

## 2016-11-20 ENCOUNTER — Encounter: Payer: Self-pay | Admitting: Pediatrics

## 2016-11-20 VITALS — BP 121/87 | HR 57 | Temp 97.6°F | Ht 64.0 in | Wt 207.0 lb

## 2016-11-20 DIAGNOSIS — K1370 Unspecified lesions of oral mucosa: Secondary | ICD-10-CM | POA: Diagnosis not present

## 2016-11-20 NOTE — Progress Notes (Signed)
  Subjective:   Patient ID: Leslie Abbott, female    DOB: [birth year], 51 y.o.   MRN: [id number] CC: Mouth Lesions (Upper lip 1 week)  HPI: Leslie Abbott is a 51 y.o. female presenting for Mouth Lesions (Upper lip 1 week)  Present for the past week Not acting like prior fever blisters Doesn't hurt or itch Was slightly puffy a few days ago Will crust up some, then she peels it off then will crust up again No bleeding Has peeled it off mulitple times Lips tend to stay dry Not drinking much water  Relevant past medical, surgical, family and social history reviewed. Allergies and medications reviewed and updated. History  Smoking Status  . Former Smoker  . Packs/day: 1.00  . Years: 20.00  . Types: Cigarettes  Smokeless Tobacco  . Current User   ROS: Per HPI   Objective:    BP 121/87   Pulse (!) 57   Temp 97.6 F (36.4 C) (Oral)   Ht  (1.626 m)   Wt 207 lb (93.9 kg)   BMI 35.53 kg/m   Wt Readings from Last 3 Encounters:  11/20/16 207 lb (93.9 kg)  11/10/16 205 lb 3.2 oz (93.1 kg)  09/01/16 208 lb (94.3 kg)    Gen: NAD, alert, cooperative with exam, NCAT EYES: EOMI, no conjunctival injection, or no icterus CV: WWP Resp: normal WOB Skin: 2mm slightly crusted extra layer of skin over middle of upper lip vermillion border not involved No oral lesions  Assessment & Plan:  Edom was seen today for mouth lesions.  Diagnoses and all orders for this visit:  Mouth lesion New Present for the past week Appears to be healing, dry skin rec vasoline several times throughout the day, don't lick lips If not improving let me know  Order placed inappropriately for EKG, was not performed  Follow up plan: prn Leslie Kras, MD Leslie Abbott [facility]

## 2017-01-01 ENCOUNTER — Other Ambulatory Visit: Payer: Self-pay | Admitting: Pediatrics

## 2017-01-01 DIAGNOSIS — F411 Generalized anxiety disorder: Secondary | ICD-10-CM

## 2017-05-07 ENCOUNTER — Other Ambulatory Visit: Payer: Self-pay | Admitting: Pediatrics

## 2017-05-07 DIAGNOSIS — F411 Generalized anxiety disorder: Secondary | ICD-10-CM

## 2017-05-25 ENCOUNTER — Other Ambulatory Visit: Payer: 59

## 2017-05-25 ENCOUNTER — Other Ambulatory Visit: Payer: Self-pay | Admitting: *Deleted

## 2017-05-25 DIAGNOSIS — E559 Vitamin D deficiency, unspecified: Secondary | ICD-10-CM

## 2017-05-25 DIAGNOSIS — E669 Obesity, unspecified: Secondary | ICD-10-CM

## 2017-05-25 DIAGNOSIS — E785 Hyperlipidemia, unspecified: Secondary | ICD-10-CM

## 2017-05-25 DIAGNOSIS — R7303 Prediabetes: Secondary | ICD-10-CM

## 2017-05-25 LAB — BAYER DCA HB A1C WAIVED: HB A1C: 5.7 % (ref <7.0)

## 2017-05-26 LAB — BMP8+EGFR
BUN / CREAT RATIO: 19 (ref 9–23)
BUN: 14 mg/dL (ref 6–24)
CO2: 24 mmol/L (ref 20–29)
Calcium: 9.5 mg/dL (ref 8.7–10.2)
Chloride: 104 mmol/L (ref 96–106)
Creatinine, Ser: 0.73 mg/dL (ref 0.57–1.00)
GFR calc Af Amer: 110 mL/min/{1.73_m2} (ref >59)
GFR, EST NON AFRICAN AMERICAN: 96 mL/min/{1.73_m2} (ref >59)
Glucose: 89 mg/dL (ref 65–99)
POTASSIUM: 4.6 mmol/L (ref 3.5–5.2)
SODIUM: 143 mmol/L (ref 134–144)

## 2017-05-26 LAB — VITAMIN D 25 HYDROXY (VIT D DEFICIENCY, FRACTURES): Vit D, 25-Hydroxy: 30.2 ng/mL (ref 30.0–100.0)

## 2017-05-26 LAB — LIPID PANEL
Chol/HDL Ratio: 4.2 ratio (ref 0.0–4.4)
Cholesterol, Total: 220 mg/dL — ABNORMAL HIGH (ref 100–199)
HDL: 52 mg/dL (ref >39)
LDL CALC: 115 mg/dL — AB (ref 0–99)
Triglycerides: 263 mg/dL — ABNORMAL HIGH (ref 0–149)
VLDL Cholesterol Cal: 53 mg/dL — ABNORMAL HIGH (ref 5–40)

## 2017-05-26 LAB — TSH: TSH: 3.42 u[IU]/mL (ref 0.450–4.500)

## 2017-06-22 ENCOUNTER — Encounter: Payer: Self-pay | Admitting: Pediatrics

## 2017-06-22 ENCOUNTER — Ambulatory Visit (INDEPENDENT_AMBULATORY_CARE_PROVIDER_SITE_OTHER): Payer: 59 | Admitting: Pediatrics

## 2017-06-22 VITALS — BP 106/73 | HR 69 | Temp 98.6°F | Ht 64.0 in | Wt 204.4 lb

## 2017-06-22 DIAGNOSIS — R7303 Prediabetes: Secondary | ICD-10-CM

## 2017-06-22 DIAGNOSIS — F411 Generalized anxiety disorder: Secondary | ICD-10-CM

## 2017-06-22 DIAGNOSIS — Z720 Tobacco use: Secondary | ICD-10-CM

## 2017-06-22 DIAGNOSIS — G4700 Insomnia, unspecified: Secondary | ICD-10-CM

## 2017-06-22 MED ORDER — ZOLPIDEM TARTRATE 10 MG PO TABS: 10.0000 mg | ORAL_TABLET | Freq: Every evening | ORAL | 5 refills | Status: DC | PRN

## 2017-06-22 MED ORDER — BUSPIRONE HCL 10 MG PO TABS: 10.0000 mg | ORAL_TABLET | Freq: Two times a day (BID) | ORAL | 3 refills | Status: DC

## 2017-06-22 MED ORDER — ESCITALOPRAM OXALATE 20 MG PO TABS: 20.0000 mg | ORAL_TABLET | Freq: Every day | ORAL | 1 refills | Status: DC

## 2017-06-22 NOTE — Progress Notes (Signed)
  Subjective:   Patient ID: Leslie Abbott, female    DOB: [birth year], 51 y.o.   MRN: [id number] CC: Follow-up med problems HPI: Leslie Abbott is a 51 y.o. female presenting for Follow-up  Insomnia: takes [place] apprx 3 nights a week, takes the nights she is working  Anxiety: some improvement with current meds Still with mood problems\taking buspar prn, usually on days she works, not every day  Snoring most nights Feels tired during the day, not falling asleep much during the day In bed by 930-10pm, wakes up at 5am Feels tired during the day  Smoking daily, open to quitting, has been hard  Prediabetes: A1c 5.7, not eating much sugary foods Does most cooking at home Drinks apprx 2 drinks wine most nights  Relevant past medical, surgical, family and social history reviewed. Allergies and medications reviewed and updated.  ROS: Per HPI   Objective:    BP 106/73   Pulse 69   Temp 98.6 F (37 C) (Oral)   Ht 5\' 4"  (1.626 m)   Wt 204 lb 6.4 oz (92.7 kg)   BMI 35.09 kg/m   Wt Readings from Last 3 Encounters:  06/22/17 204 lb 6.4 oz (92.7 kg)  11/20/16 207 lb (93.9 kg)  11/10/16 205 lb 3.2 oz (93.1 kg)    Gen: NAD, alert, cooperative with exam, NCAT EYES: EOMI, no conjunctival injection, or no icterus ENT:  TMs pearly gray b/l, OP without erythema LYMPH: no cervical LAD CV: NRRR, normal S1/S2, no murmur, distal pulses 2+ b/l Resp: CTABL, no wheezes, normal WOB Abd: +BS, soft, NTND. no guarding or organomegaly Ext: No edema, warm Neuro: Alert and oriented, strength equal b/l UE and LE, coordination grossly normal MSK: normal muscle bulk Psych: normal affect, no thoughts of self harm  Assessment & Plan:  Leslie BallRobin was seen today for follow-up multiple med problems  Diagnoses and all orders for this visit:  GAD (generalized anxiety disorder) Some ongoing symptoms Take buspar every day lexapro increase to 20mg  -     escitalopram (LEXAPRO) 20 MG tablet; Take 1 tablet (20 mg  total) daily by mouth. -     busPIRone (BUSPAR) 10 MG tablet; Take 1 tablet (10 mg total) 2 (two) times daily by mouth.  Insomnia, unspecified type Takes ambien apprx 3 nights a week -     zolpidem (AMBIEN) 10 MG tablet; Take 1 tablet (10 mg total) at bedtime as needed by mouth for sleep.  Tobacco abuse Ongoing use, open to decreasing amount  Prediabetes Discussed diet changes, weight loss, regular exercise  Follow up plan: Return in about 6 months (around 12/20/2017). Rex Krasarol Vincent, MD [facility]

## 2017-06-22 NOTE — Patient Instructions (Signed)
Goal 5 - 10 lbs weight loss over next few months

## 2017-07-02 ENCOUNTER — Other Ambulatory Visit: Payer: Self-pay | Admitting: Pediatrics

## 2017-07-02 DIAGNOSIS — G4700 Insomnia, unspecified: Secondary | ICD-10-CM

## 2017-09-24 ENCOUNTER — Ambulatory Visit (INDEPENDENT_AMBULATORY_CARE_PROVIDER_SITE_OTHER): Payer: Managed Care, Other (non HMO) | Admitting: Pediatrics

## 2017-09-24 ENCOUNTER — Encounter: Payer: Self-pay | Admitting: Pediatrics

## 2017-09-24 VITALS — BP 128/82 | HR 93 | Temp 98.1°F | Ht 64.0 in | Wt 207.0 lb

## 2017-09-24 DIAGNOSIS — Z6835 Body mass index (BMI) 35.0-35.9, adult: Secondary | ICD-10-CM | POA: Diagnosis not present

## 2017-09-24 DIAGNOSIS — G5600 Carpal tunnel syndrome, unspecified upper limb: Secondary | ICD-10-CM

## 2017-09-24 DIAGNOSIS — R0683 Snoring: Secondary | ICD-10-CM

## 2017-09-24 NOTE — Progress Notes (Signed)
  Subjective:   Patient ID: Leslie Abbott, female    DOB: 07/26/1966, 52 y.o.   MRN: [id number] CC: Weight Loss Management  HPI: Leslie Abbott is a 52 y.o. female presenting for Weight Loss Management  Breakfast: has a wrap with flour, sausage, eggs, bacon Lunch: potatoes, rice, fried chicken Dinner: [place] meat, hot sauce, chicken, broccoli, salad greens, corn bread Snack: peanuts  Drinks coffee in the morning, now drinking pepsi   Not doing any physical activity Feeling tired all the time  Goes to bed 930pm, wakes up 3am  Feels like she is constantly tossing and turning Takes ambien nights before she works  Feeling fatigued throughout the day Sister has told her she stopped breathing  Carpal tunnel syndrome: wearing wrist brace much of the time, helping some  Relevant past medical, surgical, family and social history reviewed. Allergies and medications reviewed and updated. Social History   Tobacco Use  Smoking Status Former Smoker  . Packs/day: 1.00  . Years: 20.00  . Pack years: 20.00  . Types: Cigarettes  Smokeless Tobacco Current User   ROS: Per HPI   Objective:    BP 128/82   Pulse 93   Temp 98.1 F (36.7 C) (Oral)   Ht 5\' 4"  (1.626 m)   Wt 207 lb (93.9 kg)   BMI 35.53 kg/m   Wt Readings from Last 3 Encounters:  09/24/17 207 lb (93.9 kg)  06/22/17 204 lb 6.4 oz (92.7 kg)  11/20/16 207 lb (93.9 kg)    Gen: NAD, alert, cooperative with exam, NCAT EYES: EOMI, no conjunctival injection, or no icterus ENT:  OP without erythema LYMPH: no cervical LAD CV: NRRR, normal S1/S2, no murmur, distal pulses 2+ b/l Resp: CTABL, no wheezes, normal WOB Abd: +BS, soft, NTND.  Ext: No edema, warm Neuro: Alert and oriented, strength equal b/l UE and LE, coordination grossly normal  Assessment & Plan:  Leslie Abbott was seen today for weight loss management.  Diagnoses and all orders for this visit:  Snoring Witnessed apneic events, tired during day. Will refer to further  eval -     Ambulatory referral to Neurology  BMI 35.0-35.9,adult Discussed lifestyle changes, will get sleep apnea symptoms evaluated as above.   Carpal tunnel syndrome, unspecified laterality Cont brace, pt says symptoms improved. Not always wearing it at night, notices sometimes symptoms worse then.  I spent 25 minutes with the patient with over 50% of the encounter time dedicated to counseling on the above problems.   Follow up plan: Return in about 3 months (around 12/22/2017). Rex Krasarol Vincent, MD [facility]

## 2017-10-26 ENCOUNTER — Encounter (INDEPENDENT_AMBULATORY_CARE_PROVIDER_SITE_OTHER): Payer: Self-pay

## 2017-10-26 ENCOUNTER — Ambulatory Visit (INDEPENDENT_AMBULATORY_CARE_PROVIDER_SITE_OTHER): Payer: Managed Care, Other (non HMO) | Admitting: Neurology

## 2017-10-26 ENCOUNTER — Encounter: Payer: Self-pay | Admitting: Neurology

## 2017-10-26 VITALS — BP 132/87 | HR 75 | Ht 64.0 in | Wt 208.0 lb

## 2017-10-26 DIAGNOSIS — G4700 Insomnia, unspecified: Secondary | ICD-10-CM | POA: Diagnosis not present

## 2017-10-26 DIAGNOSIS — G4710 Hypersomnia, unspecified: Secondary | ICD-10-CM

## 2017-10-26 DIAGNOSIS — Z72821 Inadequate sleep hygiene: Secondary | ICD-10-CM

## 2017-10-26 DIAGNOSIS — F17200 Nicotine dependence, unspecified, uncomplicated: Secondary | ICD-10-CM

## 2017-10-26 DIAGNOSIS — G4726 Circadian rhythm sleep disorder, shift work type: Secondary | ICD-10-CM | POA: Diagnosis not present

## 2017-10-26 DIAGNOSIS — R0683 Snoring: Secondary | ICD-10-CM | POA: Diagnosis not present

## 2017-10-26 DIAGNOSIS — G4730 Sleep apnea, unspecified: Secondary | ICD-10-CM

## 2017-10-26 DIAGNOSIS — R5383 Other fatigue: Secondary | ICD-10-CM | POA: Diagnosis not present

## 2017-10-26 NOTE — Progress Notes (Signed)
SLEEP MEDICINE CLINIC   Provider:  Melvyn Novas, M D  Primary Care Physician:  Johna Sheriff, MD   Referring Provider: Johna Sheriff, MD    Chief Complaint  Patient presents with  . New Patient (Initial Visit)    pt alone, rm 10. pt states that she has difficulty with going to sleep and staying asleep. pt says that she is tired  during the day. been taking ambien to help for 8 years. pt is told she snores in sleep    HPI:  Leslie Abbott is a 52 y.o. female , seen here  in a referral from Dr. Oswaldo Done for a sleep apnea evaluation, noting the patient snores.  Leslie Abbott is married and her husband has told her he can hear her snore loudly, sometimes far away from the bedroom in the living room. This has been going on for years.  Leslie Abbott used to work night shifts, for many years until she stopped and 2011/ 2012.  She now works early shifts. Her sister shared a motel room with her and she noted apnea, the cessation of breathing alongside snoring. Medical diagnosis: pre-diabetes, smoker, obese, carpal tunnel.   Chief complaint according to patient : " my sleep is not refreshing "   Sleep habits are as follows: works from 7 AM to 7 PM 3 days a week. Reaches home by 7.45 PM and watches Tv, emails , plays on her cell phone, has a glas of wine - but she doesn't eat dinner. Her main meal is at 2 PM at work. On days off she eat supper at 5.30 PM.  She takes Ambien only on work days ( Ambien 10 mg). She uses a box fan for white noise. She goes to her bedroom at 10 PM, and asleep most night by 30 minutes. No sleep walking as an adult, but in childhood.  The bedroom is cool, dark and quiet- no screens, no tv.. She sleeps on her side, and wakes up on her side.  She can stay asleep for about 3 hours, she wakes up and is not sure why it is not nocturia. Her sleep is very restless after that. Her husband complaint about her tossing an turning and snoring. She wakes before her alarm-clock rings.  She  sleeps on average 6 hours. She tries not to nap, and if she sleeps it will be for one hour.    Sleep medical history and family sleep history: No family history- her sister was a night shift worker and wakes up frequently.   Social history: married, one child son, 9 years old.  Tobacco use: 1/2 ppd, for 30 years, ETOH : red wine- 1 -2 an evening, some nights margaritas, 2 with restaurant dinner. Caffeine : mornings  2 cup of coffee, mountain dew , pepsi  2-3  bottles a day , Iced tea only when eating out. Monster and 5 hour energy drinks. B 12 shots .     Review of Systems: Out of a complete 14 system review, the patient complains of only the following symptoms, and all other reviewed systems are negative.  Snoring, chronic insomnia,  Was a shift Financial controller.   Epworth score 16/ 24  High ! , Fatigue severity score 56 / 63 points - HIGH   , depression score ; n/a    Social History   Socioeconomic History  . Marital status: Married    Spouse name: Not on file  . Number of children: Not on file  .  Years of education: Not on file  . Highest education level: Not on file  Social Needs  . Financial resource strain: Not on file  . Food insecurity - worry: Not on file  . Food insecurity - inability: Not on file  . Transportation needs - medical: Not on file  . Transportation needs - non-medical: Not on file  Occupational History  . Not on file  Tobacco Use  . Smoking status: Former Smoker    Packs/day: 1.00    Years: 20.00    Pack years: 20.00    Types: Cigarettes  . Smokeless tobacco: Current User  Substance and Sexual Activity  . Alcohol use: Yes    Alcohol/week: 1.8 oz    Types: 3 Cans of beer per week  . Drug use: No  . Sexual activity: Not on file  Other Topics Concern  . Not on file  Social History Narrative  . Not on file    Family History  Problem Relation Age of Onset  . Cancer Mother        lung  . Heart disease Father        heart attack    Past Medical  History:  Diagnosis Date  . Anxiety   . Depression     Past Surgical History:  Procedure Laterality Date  . birth mark     birth mark removal 40 years ago  . TUBAL LIGATION      Current Outpatient Medications  Medication Sig Dispense Refill  . Biotin 5000 MCG TABS Take 1 tablet by mouth daily.    . busPIRone (BUSPAR) 10 MG tablet Take 1 tablet (10 mg total) 2 (two) times daily by mouth. 180 tablet 3  . escitalopram (LEXAPRO) 20 MG tablet Take 1 tablet (20 mg total) daily by mouth. 90 tablet 1  . Misc Natural Products (OSTEO BI-FLEX TRIPLE STRENGTH PO) Take 2 tablets by mouth daily.    Marland Kitchen zolpidem (AMBIEN) 10 MG tablet Take 1 tablet (10 mg total) at bedtime as needed by mouth for sleep. 20 tablet 5   No current facility-administered medications for this visit.     Allergies as of 10/26/2017  . (No Known Allergies)    Vitals: BP 132/87   Pulse 75   Ht 5\' 4"  (1.626 m)   Wt 208 lb (94.3 kg)   BMI 35.70 kg/m  Last Weight:  Wt Readings from Last 1 Encounters:  10/26/17 208 lb (94.3 kg)   ONG:EXBM mass index is 35.7 kg/m.     Last Height:   Ht Readings from Last 1 Encounters:  10/26/17 5\' 4"  (1.626 m)    Physical exam:  General: The patient is awake, alert and appears not in acute distress. The patient is well groomed. Head: Normocephalic, atraumatic. Neck is supple. Mallampati 2  neck circumference:16.  Nasal airflow congested- ,Retrognathia is seen. The patient wore braces in teenage. She has bruxism.  Cardiovascular:  Regular rate and rhythm , without  murmurs or carotid bruit, and without distended neck veins. Respiratory: Lungs are clear to auscultation. Skin:  Without evidence of edema, or rash Trunk: BMI is 36. The patient's posture is erect,   Neurologic exam : The patient is awake and alert, oriented to place and time.  Attention span & concentration ability appears normal.  Speech is fluent,  With a lisp,  dysarthria Mood and affect are  appropriate.  Cranial nerves: Pupils are equal and briskly reactive to light. Funduscopic exam without evidence of pallor or edema.  Extraocular movements  in vertical and horizontal planes intact and without nystagmus. Visual fields by finger perimetry are intact. Hearing to finger rub intact. Facial sensation intact to fine touch.Facial motor strength is symmetric and tongue and uvula move midline. Shoulder shrug was symmetrical.   Motor exam: Normal tone, muscle bulk and symmetric strength in all extremities. Sensory:  Fine touch, pinprick and vibration were tested in all extremities. Proprioception tested in the upper extremities was normal. Coordination: Rapid alternating movements in the fingers/hands was normal. Finger-to-nose maneuver  normal without evidence of ataxia, dysmetria or tremor. Gait and station: Patient walks without assistive device. Strength within normal limits. Stance is stable and normal.  Toe and heel stand were tested .Tandem gait is unfragmented. Turns with 3 Steps. Romberg testing is negative.  Deep tendon reflexes: in the  upper and lower extremities are symmetric and intact. Babinski maneuver response is downgoing.    Assessment:  After physical and neurologic examination, review of laboratory studies,  Personal review of imaging studies, reports of other /same  Imaging studies, results of polysomnography and / or neurophysiology testing and pre-existing records as far as provided in visit., my assessment is   I advised the patient of the danger of taking Ambien regularly and it's habit forming potential. She combines Ambien almost nightly with alcohol.  I warned her to take Melatonin or benadryl instead of Ambien if she drank alcohol.   1) OSA -  Snoring, loud and witnessed apnea. She has risk factors of a BMI over 35, larger than average neck and congested nasal pathway, but denies respiratory allergies. She is a tobacco user for over 2 decades, and at higher  risk for COPD, asthma, hypoxemia.   2) Mouth breather , habitual. Wakes with a dry mouth   3)  Chronic insomnia,  Insomnia with poor sleep hygiene. That  may be a legacy from long term night shift work.  There are some sleep hygiene rules that should be implemented and caffeine reduction, ETOH reduction , too.  I asked her to set a regular time and routine for bedtime. Weight loss and alcohol cessation will allow her to snore less, walking regimen 20 minutes of walking a day.     The patient was advised of the nature of the diagnosed disorder , the treatment options and the  risks for general health and wellness arising from not treating the condition. I will order a SPLIT night study, attended- but [employer] is unlikely to cover.   We will order a HST to screen for apnea.   I spent more than 35 minutes of face to face time with the patient.  Greater than 50% of time was spent in counseling and coordination of care. We have discussed the diagnosis and differential and I answered the patient's questions.    Plan:  Treatment plan and additional workup :   Leslie Abbott describes a very fragmented sleep, she wakes up multiple times she has sometimes trouble to fall asleep and that is where Ambien was prescribed, but after 3 or 4 hours of sleep she is certainly awake at least once in the second half of the night is not nearly as sound and uninterrupted. She has chronic insomnia.  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  She did  take Ambien while she worked night shifts- In daytime, uses it over 8 years- Weight loss with medical weight management.  Smoking cessation.  Caffeine , alcohol use reduction.    HST ordered, SPLIT ordered, will see if [employer] allows an attended sleep study.    Melvyn Novas, MD 10/26/2017, 1:22 PM  Certified in Neurology by ABPN Certified in Sleep Medicine by [facility] [address], [address] [place], [place] [address]

## 2017-10-26 NOTE — Patient Instructions (Signed)

## 2017-11-23 ENCOUNTER — Ambulatory Visit (INDEPENDENT_AMBULATORY_CARE_PROVIDER_SITE_OTHER): Payer: Managed Care, Other (non HMO) | Admitting: Neurology

## 2017-11-23 DIAGNOSIS — G4700 Insomnia, unspecified: Secondary | ICD-10-CM

## 2017-11-23 DIAGNOSIS — G4731 Primary central sleep apnea: Secondary | ICD-10-CM

## 2017-11-23 DIAGNOSIS — G4710 Hypersomnia, unspecified: Secondary | ICD-10-CM

## 2017-11-23 DIAGNOSIS — F17200 Nicotine dependence, unspecified, uncomplicated: Secondary | ICD-10-CM

## 2017-11-23 DIAGNOSIS — G4730 Sleep apnea, unspecified: Secondary | ICD-10-CM

## 2017-11-23 DIAGNOSIS — R5383 Other fatigue: Secondary | ICD-10-CM

## 2017-11-23 DIAGNOSIS — Z72821 Inadequate sleep hygiene: Secondary | ICD-10-CM

## 2017-11-23 DIAGNOSIS — G4733 Obstructive sleep apnea (adult) (pediatric): Secondary | ICD-10-CM | POA: Diagnosis not present

## 2017-11-23 DIAGNOSIS — G4739 Other sleep apnea: Secondary | ICD-10-CM

## 2017-11-23 DIAGNOSIS — G4726 Circadian rhythm sleep disorder, shift work type: Secondary | ICD-10-CM

## 2017-11-23 DIAGNOSIS — R0683 Snoring: Secondary | ICD-10-CM

## 2017-11-23 DIAGNOSIS — G4734 Idiopathic sleep related nonobstructive alveolar hypoventilation: Principal | ICD-10-CM

## 2017-11-24 ENCOUNTER — Telehealth: Payer: Self-pay | Admitting: Neurology

## 2017-11-24 NOTE — Addendum Note (Signed)
Addended by: Melvyn NovasHMEIER, Maycie Luera on: 11/24/2017 12:48 PM   Modules accepted: Orders

## 2017-11-24 NOTE — Telephone Encounter (Signed)
-----   Message from Melvyn Novasarmen Dohmeier, MD sent at 11/24/2017 12:48 PM EDT ----- IMPRESSION: 1) Moderate- Severe unclassified Sleep Apnea type  (78% unclassified and 20% obstructive). 2) prolonged desaturation  of Oxygen (hypoxemia for 12 % of the valid test time)  RECOMMENDATION: Apnea with Hypoxemia requires CPAP treatment for  the correction of both.  Auto CPAP is not indicated as therapy in central sleep apnea, but  this patient's apnea was unclassified- allow for in lab  titration.

## 2017-11-24 NOTE — Telephone Encounter (Signed)

## 2017-11-24 NOTE — Procedures (Signed)
NAME: Leslie Abbott                                                                     DOB: 1965-11-03  MEDICAL RECORD NO. : [id number]                                        DOS: 11/23/2017 REFERRING PHYSICIAN: Rex Krasarol Vincent, M.D. STUDY PERFORMED: Home Sleep Study by [facility] HISTORY: Leslie BoughRobin Tostenson is a 52 y.o. female whose husband has told her he can hear her snore loudly, sometimes far away from the bedroom in the couple's living room. This has been going on for years.  Mrs. Wanita ChamberlainBrim used to work night shifts, for many years until 2011/ 2012.  She now works early shifts. Her sister shared during a trip a motel room with her and she noted apnea, the cessation of breathing alongside snoring. Medical diagnosis: pre-diabetes, smoker, obese, carpal tunnel.  Chief complaint according to patient: "my sleep is not refreshing"  Snoring, chronic insomnia, and former night shift worker.   Epworth score 16/ 24!  BMI: 35.7  STUDY RESULTS:  Total Recording Time: 11 h, 16 m. TST: 8 h and 44 m Total Apnea/Hypopnea Index (AHI):  34.4 /h; RDI was 38.7 /h Average Oxygen Saturation: 91 %; Lowest Oxygen Desaturation: 74 %  Total Time in Oxygen Saturation below 89 % was 64 minutes  Average Heart Rate:  85 bpm (between 53 and113 bpm) IMPRESSION: 1) Moderate- Severe unclassified Sleep Apnea type (78% unclassified and 20% obstructive). 2) prolonged desaturation of Oxygen (hypoxemia for 12 % of the valid test time)  RECOMMENDATION: Apnea with Hypoxemia requires CPAP treatment for the correction of both.  Auto CPAP is not indicated as therapy in central sleep apnea, but this patient's apnea was unclassified- allow for in lab titration.   I certify that I have reviewed the raw data recording prior to the issuance of this report in accordance with the standards of the American Academy of Sleep Medicine (AASM).  Melvyn Novasarmen Morelia Cassells, M.D.    11-25-2017  Medical Director of [facility], accredited by the AASM Diplomat of the  ABPN and ABSM

## 2017-12-01 ENCOUNTER — Telehealth: Payer: Self-pay

## 2017-12-01 ENCOUNTER — Other Ambulatory Visit: Payer: Self-pay | Admitting: Neurology

## 2017-12-01 DIAGNOSIS — G4730 Sleep apnea, unspecified: Secondary | ICD-10-CM

## 2017-12-01 DIAGNOSIS — R0683 Snoring: Secondary | ICD-10-CM

## 2017-12-01 DIAGNOSIS — G4710 Hypersomnia, unspecified: Secondary | ICD-10-CM

## 2017-12-01 NOTE — Telephone Encounter (Signed)
Called the patient to make her aware of this and there was no answer. LVM for pt to call back

## 2017-12-01 NOTE — Telephone Encounter (Signed)
Need you to place an order for Auto CPAP. Insurance did not approve for in lab sleep study due to HST not showing enough evidence for Central Sleep Apnea. Also, no additional co-morbidities to allow for in-lab titration.

## 2017-12-02 ENCOUNTER — Telehealth: Payer: Self-pay | Admitting: Neurology

## 2017-12-02 NOTE — Telephone Encounter (Signed)
Pt has returned the call to RN Baird Lyonsasey, she is asking for a call when RN Baird LyonsCasey is available

## 2017-12-02 NOTE — Telephone Encounter (Signed)
Pt states she had a message from [employer] on yesterday, pt is asking for a call back

## 2017-12-02 NOTE — Telephone Encounter (Signed)
Called the pt back no answer. LVM for her to call back

## 2017-12-02 NOTE — Telephone Encounter (Signed)
Called the patient back and made her aware that her insurance did not cover the sleep study to be completed in the lab in the meantime they are recommending starting a auto cpap. I have ordered auto cpap on behalf of Dr Vickey Hugerohmeier and scheduled her a follow up apt. The order was sent to [facility]. Pt verbalized understanding. Pt had no questions at this time but was encouraged to call back if questions arise.

## 2018-01-22 ENCOUNTER — Other Ambulatory Visit: Payer: Self-pay | Admitting: Pediatrics

## 2018-01-22 DIAGNOSIS — G4700 Insomnia, unspecified: Secondary | ICD-10-CM

## 2018-01-22 NOTE — Telephone Encounter (Signed)
Please review

## 2018-01-25 NOTE — Telephone Encounter (Signed)
Needs f/u appt 

## 2018-01-26 NOTE — Telephone Encounter (Signed)
lmtcb

## 2018-01-27 ENCOUNTER — Ambulatory Visit (INDEPENDENT_AMBULATORY_CARE_PROVIDER_SITE_OTHER): Payer: Managed Care, Other (non HMO) | Admitting: Pediatrics

## 2018-01-27 ENCOUNTER — Encounter: Payer: Self-pay | Admitting: Pediatrics

## 2018-01-27 VITALS — BP 120/78 | HR 79 | Temp 98.2°F | Ht 64.0 in | Wt 212.2 lb

## 2018-01-27 DIAGNOSIS — G4700 Insomnia, unspecified: Secondary | ICD-10-CM

## 2018-01-27 DIAGNOSIS — Z1211 Encounter for screening for malignant neoplasm of colon: Secondary | ICD-10-CM | POA: Diagnosis not present

## 2018-01-27 DIAGNOSIS — F411 Generalized anxiety disorder: Secondary | ICD-10-CM

## 2018-01-27 MED ORDER — ZOLPIDEM TARTRATE 10 MG PO TABS: 10.0000 mg | ORAL_TABLET | Freq: Every evening | ORAL | 5 refills | Status: DC | PRN

## 2018-01-27 MED ORDER — ESCITALOPRAM OXALATE 20 MG PO TABS: 20.0000 mg | ORAL_TABLET | Freq: Every day | ORAL | 3 refills | Status: DC

## 2018-01-27 MED ORDER — BUSPIRONE HCL 10 MG PO TABS: 10.0000 mg | ORAL_TABLET | Freq: Two times a day (BID) | ORAL | 3 refills | Status: DC

## 2018-01-27 NOTE — Progress Notes (Signed)
  Subjective:   Patient ID: Leslie Abbott, female    DOB: 28-Aug-1965, 52 y.o.   MRN: [id number] CC: Medical Management of Chronic Issues  HPI: Leslie Abbott is a 52 y.o. female   Insomnia: Takes Ambien the night before work.  About 20 days a month.  She has a hard time falling asleep without it.  She has recently started treatment for obstructive sleep apnea with a CPAP machine.  She says she wakes up feeling better rested.  Anxiety: Taking Lexapro and BuSpar regularly.  She says her symptoms are well controlled on that, she would like to continue.  Colon cancer screening: Due.  Declines colonoscopy.  Would like to have Cologuard done. Has annual exam with gynecology scheduled for tomorrow, mammogram and DEXA scan scheduled for tomorrow.  Relevant past medical, surgical, family and social history reviewed. Allergies and medications reviewed and updated. Social History   Tobacco Use  Smoking Status Former Smoker  . Packs/day: 1.00  . Years: 20.00  . Pack years: 20.00  . Types: Cigarettes  Smokeless Tobacco Current User   ROS: Per HPI   Objective:    BP 120/78   Pulse 79   Temp 98.2 F (36.8 C) (Oral)   Ht 5\' 4"  (1.626 m)   Wt 212 lb 3.2 oz (96.3 kg)   BMI 36.42 kg/m   Wt Readings from Last 3 Encounters:  01/27/18 212 lb 3.2 oz (96.3 kg)  10/26/17 208 lb (94.3 kg)  09/24/17 207 lb (93.9 kg)    Gen: NAD, alert, cooperative with exam, NCAT EYES: EOMI, no conjunctival injection, or no icterus ENT:  TMs pearly gray b/l, OP without erythema LYMPH: no cervical LAD CV: NRRR, normal S1/S2, no murmur, distal pulses 2+ b/l Resp: CTABL, no wheezes, normal WOB Abd: +BS, soft, NTND. no guarding or organomegaly Ext: No edema, warm Neuro: Alert and oriented  Assessment & Plan:  Leslie Abbott was seen today for medical management of chronic issues.  Diagnoses and all orders for this visit:  GAD (generalized anxiety disorder) Stable, continue below -     busPIRone (BUSPAR) 10 MG tablet; Take  1 tablet (10 mg total) by mouth 2 (two) times daily. -     escitalopram (LEXAPRO) 20 MG tablet; Take 1 tablet (20 mg total) by mouth daily.  Insomnia, unspecified type Improving with treatment for obstructive sleep apnea.  Continue to try to minimize below.  Taking nights before working. #20 tabs a month. -     zolpidem (AMBIEN) 10 MG tablet; Take 1 tablet (10 mg total) by mouth at bedtime as needed for sleep.  Colon cancer screening -     Cologuard   Follow up plan: Return in about 6 months (around 07/29/2018). Rex Krasarol Breasia Karges, MD [facility]

## 2018-01-29 NOTE — Telephone Encounter (Signed)
Patient seen 01/27/18

## 2018-03-02 LAB — COLOGUARD: Cologuard: NEGATIVE

## 2018-03-03 ENCOUNTER — Encounter: Payer: Self-pay | Admitting: Nurse Practitioner

## 2018-03-03 DIAGNOSIS — G4733 Obstructive sleep apnea (adult) (pediatric): Secondary | ICD-10-CM

## 2018-03-03 NOTE — Progress Notes (Signed)
[facility]  PATIENT: Leslie Abbott DOB: 04-Mar-1966   REASON FOR VISIT: Follow-up for newly diagnosed obstructive sleep apnea with initial CPAP HISTORY FROM: Patient    HISTORY OF PRESENT ILLNESS:UPDATE 7/19/2019CM Ms. [age] year old female returns for follow-up with newly diagnosed obstructive sleep apnea here for initial CPAP.  Patient claims he used to the machine.  She still has some days she also has a leak she has a nasal mask.  Compliance data 02/02/2018-03/03/2018 shows compliance greater than 4 hours at 93%.  Average usage 5 hours 41 minutes.  Set pressure 5 to 15 cm.  EPR level 3 leak 38.5 AHI 5.2.   she returns for reevaluation   3/11/19CDRobin Abbott is a 52 y.o. female , seen here  in a referral from Dr. Oswaldo Done for a sleep apnea evaluation, noting the patient snores.  Mrs. Imm is married and her husband has told her he can hear her snore loudly, sometimes far away from the bedroom in the living room. This has been going on for years.  Mrs. Bayless used to work night shifts, for many years until she stopped and 2011/ 2012.  She now works early shifts. Her sister shared a motel room with her and she noted apnea, the cessation of breathing alongside snoring. Medical diagnosis: pre-diabetes, smoker, obese, carpal tunnel.   Chief complaint according to patient : " my sleep is not refreshing "   Sleep habits are as follows: works from 7 AM to 7 PM 3 days a week. Reaches home by 7.45 PM and watches Tv, emails , plays on her cell phone, has a glas of wine - but she doesn't eat dinner. Her main meal is at 2 PM at work. On days off she eat supper at 5.30 PM.  She takes Ambien only on work days ( Ambien 10 mg). She uses a box fan for white noise. She goes to her bedroom at 10 PM, and asleep most night by 30 minutes. No sleep walking as an adult, but in childhood.  The bedroom is cool, dark and quiet- no screens, no tv.. She sleeps on her side, and wakes up on her side.  She  can stay asleep for about 3 hours, she wakes up and is not sure why it is not nocturia. Her sleep is very restless after that. Her husband complaint about her tossing an turning and snoring. She wakes before her alarm-clock rings.  She sleeps on average 6 hours. She tries not to nap, and if she sleeps it will be for one hour.     REVIEW OF SYSTEMS: Full 14 system review of systems performed and notable only for those listed, all others are neg:  Constitutional: neg  Cardiovascular: neg Ear/Nose/Throat: neg  Skin: neg Eyes: neg Respiratory: neg Gastroitestinal: neg  Hematology/Lymphatic: neg  Endocrine: neg Musculoskeletal:neg Allergy/Immunology: neg Neurological: neg Psychiatric: neg Sleep : Obstructive sleep apnea with CPAP   ALLERGIES: No Known Allergies  HOME MEDICATIONS: Outpatient Medications Prior to Visit  Medication Sig Dispense Refill  . Biotin 5000 MCG TABS Take 1 tablet by mouth daily.    . busPIRone (BUSPAR) 10 MG tablet Take 1 tablet (10 mg total) by mouth 2 (two) times daily. 180 tablet 3  . escitalopram (LEXAPRO) 20 MG tablet Take 1 tablet (20 mg total) by mouth daily. 90 tablet 3  . Misc Natural Products (OSTEO BI-FLEX TRIPLE STRENGTH PO) Take 2 tablets by mouth daily.    Marland Kitchen zolpidem (AMBIEN) 10 MG tablet Take 1  tablet (10 mg total) by mouth at bedtime as needed for sleep. 20 tablet 5   No facility-administered medications prior to visit.     PAST MEDICAL HISTORY: Past Medical History:  Diagnosis Date  . Anxiety   . Depression     PAST SURGICAL HISTORY: Past Surgical History:  Procedure Laterality Date  . birth mark     birth mark removal 40 years ago  . TUBAL LIGATION      FAMILY HISTORY: Family History  Problem Relation Age of Onset  . Cancer Mother        lung  . Heart disease Father        heart attack    SOCIAL HISTORY: Social History   Socioeconomic History  . Marital status: Married    Spouse name: Not on file  . Number of  children: Not on file  . Years of education: Not on file  . Highest education level: Not on file  Occupational History  . Not on file  Social Needs  . Financial resource strain: Not on file  . Food insecurity:    Worry: Not on file    Inability: Not on file  . Transportation needs:    Medical: Not on file    Non-medical: Not on file  Tobacco Use  . Smoking status: Former Smoker    Packs/day: 1.00    Years: 20.00    Pack years: 20.00    Types: Cigarettes  . Smokeless tobacco: Current User  Substance and Sexual Activity  . Alcohol use: Yes    Alcohol/week: 1.8 oz    Types: 3 Cans of beer per week  . Drug use: No  . Sexual activity: Not on file  Lifestyle  . Physical activity:    Days per week: Not on file    Minutes per session: Not on file  . Stress: Not on file  Relationships  . Social connections:    Talks on phone: Not on file    Gets together: Not on file    Attends religious service: Not on file    Active member of club or organization: Not on file    Attends meetings of clubs or organizations: Not on file    Relationship status: Not on file  . Intimate partner violence:    Fear of current or ex partner: Not on file    Emotionally abused: Not on file    Physically abused: Not on file    Forced sexual activity: Not on file  Other Topics Concern  . Not on file  Social History Narrative  . Not on file     PHYSICAL EXAM  Vitals:   03/05/18 0943  BP: 137/84  Pulse: 75  Weight: 213 lb (96.6 kg)  Height: 5\' 4"  (1.626 m)   Body mass index is 36.56 kg/m.  Generalized: Well developed, obese female in no acute distress  Head: normocephalic and atraumatic,. Oropharynx benign mallopatti2 Neck: Supple, circumference 16 Lungs skin Musculoskeletal: No deformity  Skin no peripheral edema Neurological examination   Mentation: Alert oriented to time, place, history taking. Attention span and concentration appropriate. Recent and remote memory intact.  Follows  all commands speech and language fluent.   Cranial nerve II-XII: Pupils were equal round reactive to light extraocular movements were full, visual field were full on confrontational test. Facial sensation and strength were normal. hearing was intact to finger rubbing bilaterally. Uvula tongue midline. head turning and shoulder shrug were normal and symmetric.Tongue protrusion into cheek  strength was normal. Motor: normal bulk and tone, full strength in the BUE, BLE, fine finger movements normal, no pronator drift. No focal weakness Sensory: normal and symmetric to light touch,  Coordination: finger-nose-finger, heel-to-shin bilaterally, no dysmetria Gait and Station: Rising up from seated position without assistance, normal stance,  moderate stride, good arm swing, smooth turning, able to perform tiptoe, and heel walking without difficulty. Tandem gait is steady  DIAGNOSTIC DATA (LABS, IMAGING, TESTING) - I reviewed patient records, labs, notes, testing and imaging myself where available.      Component Value Date/Time   NA 143 05/25/2017 1515   K 4.6 05/25/2017 1515   CL 104 05/25/2017 1515   CO2 24 05/25/2017 1515   GLUCOSE 89 05/25/2017 1515   BUN 14 05/25/2017 1515   CREATININE 0.73 05/25/2017 1515   CALCIUM 9.5 05/25/2017 1515   GFRNONAA 96 05/25/2017 1515   GFRAA 110 05/25/2017 1515   Lab Results  Component Value Date   CHOL 220 (H) 05/25/2017   HDL 52 05/25/2017   LDLCALC 115 (H) 05/25/2017   TRIG 263 (H) 05/25/2017   CHOLHDL 4.2 05/25/2017    Lab Results  Component Value Date   TSH 3.420 05/25/2017      ASSESSMENT AND PLAN  52 y.o. year old female  has a past medical history of Anxiety and Depression. here to follow-up for newly diagnosed obstructive sleep apnea with CPAP compliance.Data 02/02/2018-03/03/2018 shows compliance greater than 4 hours at 93%.  Average usage 5 hours 41 minutes.  Set pressure 5 to 15 cm.  EPR level 3 leak 38.5 AHI 5.2.  .  PLAN: CPAP  compliance 93% Patient has a leak, needs mask refit  Increase max pressure to 16 Follow-up in 3 months  Nilda RiggsNancy Carolyn Demere Dotzler, [facility] HospitalGNP, [facility]BC, APRN  [facility] [address], [address] [place], [place] [id number] [phone]

## 2018-03-05 ENCOUNTER — Ambulatory Visit (INDEPENDENT_AMBULATORY_CARE_PROVIDER_SITE_OTHER): Payer: Managed Care, Other (non HMO) | Admitting: Nurse Practitioner

## 2018-03-05 ENCOUNTER — Encounter: Payer: Self-pay | Admitting: Nurse Practitioner

## 2018-03-05 DIAGNOSIS — G4733 Obstructive sleep apnea (adult) (pediatric): Secondary | ICD-10-CM | POA: Diagnosis not present

## 2018-03-05 DIAGNOSIS — Z9989 Dependence on other enabling machines and devices: Secondary | ICD-10-CM | POA: Diagnosis not present

## 2018-03-05 NOTE — Progress Notes (Signed)
Auto pap DME order re: mask refit, increase pressure successfully faxed to [facility].

## 2018-03-05 NOTE — Patient Instructions (Signed)
CPAP compliance 93% Patient has a leak, needs mask refit  Follow-up in 3 months

## 2018-03-08 ENCOUNTER — Telehealth: Payer: Self-pay | Admitting: *Deleted

## 2018-03-08 NOTE — Telephone Encounter (Signed)
LVM informing patient that a CPAP chip was found in an exam room. She had appointment with NP on Friday and may have left it. Requested she call back and let phone staff know if it is hers. If so, requested she let phone staff know whether she will pick it up or she wants it mailed to her.

## 2018-03-29 NOTE — Progress Notes (Signed)
Erroneous encounter by Mickie KayMichelle Callahan This encounter was created in error - please disregard.

## 2018-04-21 NOTE — Progress Notes (Signed)
I agree with the assessment and plan as directed by NP .The patient is known to me .   Joseluis Alessio, MD  

## 2018-06-23 ENCOUNTER — Encounter: Payer: Self-pay | Admitting: Nurse Practitioner

## 2018-06-24 NOTE — Progress Notes (Signed)
[facility]  PATIENT: Leslie Abbott DOB: June 26, 1966   REASON FOR VISIT: Follow-up for  obstructive sleep apnea with  CPAP compliance HISTORY FROM: Patient    HISTORY OF PRESENT ILLNESS:UPDATE 11/8/2019CM Leslie Abbott, 52 year old female returns for follow-up with history of obstructive sleep apnea here for CPAP compliance.  When last seen her initial CPAP compliance was 93% today is down to 47%.  She is not having any problems with the machine.  She had a mask refit after her last visit.  She claims she just forgets to put it on.  Compliance data dated 05/25/2018-06/23/2018 shows compliance greater than 4 hours at 47% usage days 20 out of 30 at 67%.  Average usage 5 hours 4 minutes.  Set pressure 5 to 16 cm.  EPR level 3 leak 95th percentile 5.9.  AHI 5.2.  ESS 21.  She returns for reevaluation   UPDATE 7/19/2019CM Leslie Abbott 52 year old female returns for follow-up with newly diagnosed obstructive sleep apnea here for initial CPAP.  Patient claims he used to the machine.  She still has some days she also has a leak she has a nasal mask.  Compliance data 02/02/2018-03/03/2018 shows compliance greater than 4 hours at 93%.  Average usage 5 hours 41 minutes.  Set pressure 5 to 15 cm.  EPR level 3 leak 38.5 AHI 5.2.   she returns for reevaluation   3/11/19CDRobin Abbott is a 52 y.o. female , seen here  in a referral from Leslie Abbott for a sleep apnea evaluation, noting the patient snores.  Leslie Abbott is married and her husband has told her he can hear her snore loudly, sometimes far away from the bedroom in the living room. This has been going on for years.  Leslie Abbott used to work night shifts, for many years until she stopped and 2011/ 2012.  She now works early shifts. Her sister shared a motel room with her and she noted apnea, the cessation of breathing alongside snoring. Medical diagnosis: pre-diabetes, smoker, obese, carpal tunnel.   Chief complaint according to patient : " my sleep is  not refreshing "   Sleep habits are as follows: works from 7 AM to 7 PM 3 days a week. Reaches home by 7.45 PM and watches Tv, emails , plays on her cell phone, has a glas of wine - but she doesn't eat dinner. Her main meal is at 2 PM at work. On days off she eat supper at 5.30 PM.  She takes Ambien only on work days ( Ambien 10 mg). She uses a box fan for white noise. She goes to her bedroom at 10 PM, and asleep most night by 30 minutes. No sleep walking as an adult, but in childhood.  The bedroom is cool, dark and quiet- no screens, no tv.. She sleeps on her side, and wakes up on her side.  She can stay asleep for about 3 hours, she wakes up and is not sure why it is not nocturia. Her sleep is very restless after that. Her husband complaint about her tossing an turning and snoring. She wakes before her alarm-clock rings.  She sleeps on average 6 hours. She tries not to nap, and if she sleeps it will be for one hour.     REVIEW OF SYSTEMS: Full 14 system review of systems performed and notable only for those listed, all others are neg:  Constitutional: neg  Cardiovascular: neg Ear/Nose/Throat: neg  Skin: neg Eyes: neg Respiratory: neg Gastroitestinal: neg  Hematology/Lymphatic: neg  Endocrine: neg Musculoskeletal:neg Allergy/Immunology: neg Neurological: neg Psychiatric: neg Sleep : Obstructive sleep apnea with CPAP   ALLERGIES: No Known Allergies  HOME MEDICATIONS: Outpatient Medications Prior to Visit  Medication Sig Dispense Refill  . Biotin 5000 MCG TABS Take 1 tablet by mouth daily.    . busPIRone (BUSPAR) 10 MG tablet Take 1 tablet (10 mg total) by mouth 2 (two) times daily. 180 tablet 3  . escitalopram (LEXAPRO) 20 MG tablet Take 1 tablet (20 mg total) by mouth daily. 90 tablet 3  . Misc Natural Products (OSTEO BI-FLEX TRIPLE STRENGTH PO) Take 2 tablets by mouth daily.    Marland Kitchen zolpidem (AMBIEN) 10 MG tablet Take 1 tablet (10 mg total) by mouth at bedtime as needed for sleep.  20 tablet 5   No facility-administered medications prior to visit.     PAST MEDICAL HISTORY: Past Medical History:  Diagnosis Date  . Anxiety   . Depression     PAST SURGICAL HISTORY: Past Surgical History:  Procedure Laterality Date  . birth mark     birth mark removal 40 years ago  . TUBAL LIGATION      FAMILY HISTORY: Family History  Problem Relation Age of Onset  . Cancer Mother        lung  . Heart disease Father        heart attack    SOCIAL HISTORY: Social History   Socioeconomic History  . Marital status: Married    Spouse name: Not on file  . Number of children: Not on file  . Years of education: Not on file  . Highest education level: Not on file  Occupational History  . Not on file  Social Needs  . Financial resource strain: Not on file  . Food insecurity:    Worry: Not on file    Inability: Not on file  . Transportation needs:    Medical: Not on file    Non-medical: Not on file  Tobacco Use  . Smoking status: Former Smoker    Packs/day: 1.00    Years: 20.00    Pack years: 20.00    Types: Cigarettes  . Smokeless tobacco: Current User  Substance and Sexual Activity  . Alcohol use: Yes    Alcohol/week: 3.0 standard drinks    Types: 3 Cans of beer per week  . Drug use: No  . Sexual activity: Not on file  Lifestyle  . Physical activity:    Days per week: Not on file    Minutes per session: Not on file  . Stress: Not on file  Relationships  . Social connections:    Talks on phone: Not on file    Gets together: Not on file    Attends religious service: Not on file    Active member of club or organization: Not on file    Attends meetings of clubs or organizations: Not on file    Relationship status: Not on file  . Intimate partner violence:    Fear of current or ex partner: Not on file    Emotionally abused: Not on file    Physically abused: Not on file    Forced sexual activity: Not on file  Other Topics Concern  . Not on file    Social History Narrative  . Not on file     PHYSICAL EXAM  Vitals:   06/25/18 0846  BP: (!) 155/94  Pulse: 63  Weight: 219 lb 9.6 oz (99.6 kg)  Height:  5\' 4"  (1.626 m)   Body mass index is 37.69 kg/m.  Generalized: Well developed, obese female in no acute distress  Head: normocephalic and atraumatic,. Oropharynx benign mallopatti2 Neck: Supple, circumference 16 Lungs clear  Musculoskeletal: No deformity  Skin no peripheral edema Neurological examination   Mentation: Alert oriented to time, place, history taking. Attention span and concentration appropriate. Recent and remote memory intact.  Follows all commands speech and language fluent.   Cranial nerve II-XII: Pupils were equal round reactive to light extraocular movements were full, visual field were full on confrontational test. Facial sensation and strength were normal. hearing was intact to finger rubbing bilaterally. Uvula tongue midline. head turning and shoulder shrug were normal and symmetric.Tongue protrusion into cheek strength was normal. Motor: normal bulk and tone, full strength in the BUE, BLE, fine finger movements normal, no pronator drift. No focal weakness Sensory: normal and symmetric to light touch,  Coordination: finger-nose-finger, heel-to-shin bilaterally, no dysmetria Gait and Station: Rising up from seated position without assistance, normal stance,  moderate stride, good arm swing, smooth turning, able to perform tiptoe, and heel walking without difficulty. Tandem gait is steady  DIAGNOSTIC DATA (LABS, IMAGING, TESTING) - I reviewed patient records, labs, notes, testing and imaging myself where available.      Component Value Date/Time   NA 143 05/25/2017 1515   K 4.6 05/25/2017 1515   CL 104 05/25/2017 1515   CO2 24 05/25/2017 1515   GLUCOSE 89 05/25/2017 1515   BUN 14 05/25/2017 1515   CREATININE 0.73 05/25/2017 1515   CALCIUM 9.5 05/25/2017 1515   GFRNONAA 96 05/25/2017 1515   GFRAA 110  05/25/2017 1515   Lab Results  Component Value Date   CHOL 220 (H) 05/25/2017   HDL 52 05/25/2017   LDLCALC 115 (H) 05/25/2017   TRIG 263 (H) 05/25/2017   CHOLHDL 4.2 05/25/2017    Lab Results  Component Value Date   TSH 3.420 05/25/2017      ASSESSMENT AND PLAN  52 y.o. year old female  has a past medical history of Anxiety and Depression. here to follow-up for obstructive sleep apnea with CPAP compliance.Data dated 05/25/2018-06/23/2018 shows compliance greater than 4 hours at 47% usage days 20 out of 30 at 67%.  Average usage 5 hours 4 minutes.  Set pressure 5 to 16 cm.  EPR level 3 leak 95th percentile 5.9.  AHI 5.2.  ESS 21.  PLAN: CPAP compliance 47% Discussed risk and ramifications of non-compliance with CPAP try to use it every night at least 4 hours Continue same settings Follow-up in 3 months  Nilda Riggs, [facility], [facility], APRN  [facility] [address] [place], [place] [address] [phone]

## 2018-06-25 ENCOUNTER — Encounter: Payer: Self-pay | Admitting: Nurse Practitioner

## 2018-06-25 ENCOUNTER — Ambulatory Visit (INDEPENDENT_AMBULATORY_CARE_PROVIDER_SITE_OTHER): Payer: Managed Care, Other (non HMO) | Admitting: Nurse Practitioner

## 2018-06-25 VITALS — BP 155/94 | HR 63 | Ht 64.0 in | Wt 219.6 lb

## 2018-06-25 DIAGNOSIS — Z9989 Dependence on other enabling machines and devices: Secondary | ICD-10-CM | POA: Diagnosis not present

## 2018-06-25 DIAGNOSIS — G4733 Obstructive sleep apnea (adult) (pediatric): Secondary | ICD-10-CM

## 2018-06-25 NOTE — Patient Instructions (Signed)
CPAP compliance 47% Continue same settings Follow-up in 3 months

## 2018-08-30 ENCOUNTER — Other Ambulatory Visit: Payer: Self-pay | Admitting: Pediatrics

## 2018-08-30 DIAGNOSIS — G4700 Insomnia, unspecified: Secondary | ICD-10-CM

## 2018-08-31 NOTE — Telephone Encounter (Signed)
Last seen 01/27/18  Dr Oswaldo Done

## 2018-08-31 NOTE — Telephone Encounter (Signed)
Needs to be seen, controlled substance.

## 2018-09-01 NOTE — Telephone Encounter (Signed)
lmtcb to schedule appt 

## 2018-09-17 ENCOUNTER — Ambulatory Visit (INDEPENDENT_AMBULATORY_CARE_PROVIDER_SITE_OTHER): Payer: Managed Care, Other (non HMO) | Admitting: Family Medicine

## 2018-09-17 ENCOUNTER — Telehealth: Payer: Self-pay

## 2018-09-17 ENCOUNTER — Encounter: Payer: Self-pay | Admitting: Family Medicine

## 2018-09-17 ENCOUNTER — Ambulatory Visit: Payer: Managed Care, Other (non HMO) | Admitting: Pediatrics

## 2018-09-17 VITALS — BP 124/76 | HR 68 | Temp 97.5°F | Ht 64.0 in | Wt 210.0 lb

## 2018-09-17 DIAGNOSIS — G4700 Insomnia, unspecified: Secondary | ICD-10-CM

## 2018-09-17 DIAGNOSIS — E782 Mixed hyperlipidemia: Secondary | ICD-10-CM

## 2018-09-17 DIAGNOSIS — E559 Vitamin D deficiency, unspecified: Secondary | ICD-10-CM | POA: Diagnosis not present

## 2018-09-17 DIAGNOSIS — E669 Obesity, unspecified: Secondary | ICD-10-CM | POA: Diagnosis not present

## 2018-09-17 DIAGNOSIS — F411 Generalized anxiety disorder: Secondary | ICD-10-CM

## 2018-09-17 MED ORDER — ZOLPIDEM TARTRATE 10 MG PO TABS: 10.0000 mg | ORAL_TABLET | Freq: Every evening | ORAL | 3 refills | Status: DC | PRN

## 2018-09-17 NOTE — Telephone Encounter (Signed)
Patient reports she had done Cologuard and sent it back to them but has not received any results.  Please advise.

## 2018-09-17 NOTE — Patient Instructions (Signed)

## 2018-09-17 NOTE — Progress Notes (Signed)
Subjective:    Patient ID: Leslie Abbott, female    DOB: 1966/07/10, 53 y.o.   MRN: [id number]  Chief Complaint:  Refill Ambien and Medical Management of Chronic Issues   HPI: Leslie Abbott is a 53 y.o. female presenting on 09/17/2018 for Refill Ambien and Medical Management of Chronic Issues   1. Insomnia, unspecified type  Well controlled with Ambien. States she only takes this 3-4 times per week. Denies adverse side effects. States she has been sleeping better since initiation of CPAP. States she has less daytime drowsiness since initiation of CPAP.    2. Mixed hyperlipidemia  Does not watch diet or exercise on a regular basis. Cholesterol has been elevated in the past. She is not taking medications for this, it is diet controlled.   3. Obesity, Class II, BMI 35-39.9  Does not diet or exercise on a regular basis.    4. Vitamin D deficiency  Currently not taking supplements. Does have fatigue and lack of concentration.    5. GAD (generalized anxiety disorder)  Well controlled with Lexapro and Buspar. States she it taking the medications as prescribed without adverse side effects.      Relevant past medical, surgical, family, and social history reviewed and updated as indicated.  Allergies and medications reviewed and updated.   Past Medical History:  Diagnosis Date  . Anxiety   . Depression     Past Surgical History:  Procedure Laterality Date  . birth mark     birth mark removal 40 years ago  . TUBAL LIGATION      Social History   Socioeconomic History  . Marital status: Married    Spouse name: Not on file  . Number of children: Not on file  . Years of education: Not on file  . Highest education level: Not on file  Occupational History  . Not on file  Social Needs  . Financial resource strain: Not on file  . Food insecurity:    Worry: Not on file    Inability: Not on file  . Transportation needs:    Medical: Not on file    Non-medical: Not on file    Tobacco Use  . Smoking status: Former Smoker    Packs/day: 1.00    Years: 20.00    Pack years: 20.00    Types: Cigarettes  . Smokeless tobacco: Current User  Substance and Sexual Activity  . Alcohol use: Yes    Alcohol/week: 3.0 standard drinks    Types: 3 Cans of beer per week  . Drug use: No  . Sexual activity: Not on file  Lifestyle  . Physical activity:    Days per week: Not on file    Minutes per session: Not on file  . Stress: Not on file  Relationships  . Social connections:    Talks on phone: Not on file    Gets together: Not on file    Attends religious service: Not on file    Active member of club or organization: Not on file    Attends meetings of clubs or organizations: Not on file    Relationship status: Not on file  . Intimate partner violence:    Fear of current or ex partner: Not on file    Emotionally abused: Not on file    Physically abused: Not on file    Forced sexual activity: Not on file  Other Topics Concern  . Not on file  Social History Narrative  .  Not on file    Outpatient Encounter Medications as of 09/17/2018  Medication Sig  . Biotin 5000 MCG TABS Take 1 tablet by mouth daily.  . busPIRone (BUSPAR) 10 MG tablet Take 1 tablet (10 mg total) by mouth 2 (two) times daily.  Marland Kitchen escitalopram (LEXAPRO) 20 MG tablet Take 1 tablet (20 mg total) by mouth daily.  . Misc Natural Products (OSTEO BI-FLEX TRIPLE STRENGTH PO) Take 2 tablets by mouth daily.  Marland Kitchen zolpidem (AMBIEN) 10 MG tablet Take 1 tablet (10 mg total) by mouth at bedtime as needed for sleep.  . [DISCONTINUED] zolpidem (AMBIEN) 10 MG tablet Take 1 tablet (10 mg total) by mouth at bedtime as needed for sleep.   No facility-administered encounter medications on file as of 09/17/2018.     No Known Allergies  Review of Systems  Constitutional: Positive for activity change and fatigue. Negative for chills and fever.  Respiratory: Negative for cough, choking, chest tightness and shortness of  breath.   Cardiovascular: Negative for chest pain, palpitations and leg swelling.  Gastrointestinal: Negative for abdominal pain, constipation, diarrhea, nausea and vomiting.  Genitourinary: Negative for decreased urine volume and difficulty urinating.  Neurological: Negative for dizziness, tremors, seizures, syncope, facial asymmetry, speech difficulty, weakness, light-headedness, numbness and headaches.  Psychiatric/Behavioral: Positive for decreased concentration and sleep disturbance. Negative for agitation, confusion, self-injury and suicidal ideas. The patient is nervous/anxious.   All other systems reviewed and are negative.       Objective:    BP 124/76   Pulse 68   Temp (!) 97.5 F (36.4 C) (Oral)   Ht 5' 4" (1.626 m)   Wt 210 lb (95.3 kg)   BMI 36.05 kg/m    Wt Readings from Last 3 Encounters:  09/17/18 210 lb (95.3 kg)  06/25/18 219 lb 9.6 oz (99.6 kg)  03/05/18 213 lb (96.6 kg)    Physical Exam Vitals signs and nursing note reviewed.  Constitutional:      General: She is not in acute distress.    Appearance: Normal appearance. She is well-developed and well-groomed. She is not ill-appearing or toxic-appearing.  HENT:     Head: Normocephalic and atraumatic.     Mouth/Throat:     Mouth: Mucous membranes are moist.     Pharynx: Oropharynx is clear.  Eyes:     Conjunctiva/sclera: Conjunctivae normal.     Pupils: Pupils are equal, round, and reactive to light.  Neck:     Musculoskeletal: Normal range of motion and neck supple.     Vascular: No carotid bruit.  Cardiovascular:     Rate and Rhythm: Normal rate and regular rhythm.     Heart sounds: Normal heart sounds. No murmur. No friction rub. No gallop.   Pulmonary:     Effort: Pulmonary effort is normal. No respiratory distress.     Breath sounds: Normal breath sounds.  Skin:    General: Skin is warm and dry.     Capillary Refill: Capillary refill takes less than 2 seconds.  Neurological:     General: No  focal deficit present.     Mental Status: She is alert and oriented to person, place, and time.     Cranial Nerves: No cranial nerve deficit.     Sensory: No sensory deficit.     Motor: No weakness.     Coordination: Coordination normal.     Gait: Gait normal.     Deep Tendon Reflexes: Reflexes normal.  Psychiatric:  Attention and Perception: Attention and perception normal.        Mood and Affect: Affect normal. Mood is anxious.        Speech: Speech normal.        Behavior: Behavior normal. Behavior is cooperative.        Thought Content: Thought content normal. Thought content is not paranoid or delusional. Thought content does not include homicidal or suicidal ideation. Thought content does not include homicidal or suicidal plan.        Cognition and Memory: Cognition and memory normal.        Judgment: Judgment normal.     Results for orders placed or performed in visit on 05/25/17  Bayer DCA Hb A1c Waived  Result Value Ref Range   HB A1C (BAYER DCA - WAIVED) 5.7 <7.0 %  BMP8+EGFR  Result Value Ref Range   Glucose 89 65 - 99 mg/dL   BUN 14 6 - 24 mg/dL   Creatinine, Ser 0.73 0.57 - 1.00 mg/dL   GFR calc non Af Amer 96 >59 mL/min/1.73   GFR calc Af Amer 110 >59 mL/min/1.73   BUN/Creatinine Ratio 19 9 - 23   Sodium 143 134 - 144 mmol/L   Potassium 4.6 3.5 - 5.2 mmol/L   Chloride 104 96 - 106 mmol/L   CO2 24 20 - 29 mmol/L   Calcium 9.5 8.7 - 10.2 mg/dL  VITAMIN D 25 Hydroxy (Vit-D Deficiency, Fractures)  Result Value Ref Range   Vit D, 25-Hydroxy 30.2 30.0 - 100.0 ng/mL  Lipid panel  Result Value Ref Range   Cholesterol, Total 220 (H) 100 - 199 mg/dL   Triglycerides 263 (H) 0 - 149 mg/dL   HDL 52 >39 mg/dL   VLDL Cholesterol Cal 53 (H) 5 - 40 mg/dL   LDL Calculated 115 (H) 0 - 99 mg/dL   Chol/HDL Ratio 4.2 0.0 - 4.4 ratio  TSH  Result Value Ref Range   TSH 3.420 0.450 - 4.500 uIU/mL       Pertinent labs & imaging results that were available during my care  of the patient were reviewed by me and considered in my medical decision making.  Assessment & Plan:  Daveigh was seen today for refill ambien.  Diagnoses and all orders for this visit:  Insomnia, unspecified type -     zolpidem (AMBIEN) 10 MG tablet; Take 1 tablet (10 mg total) by mouth at bedtime as needed for sleep.  Mixed hyperlipidemia -     CMP14+EGFR -     CBC with Differential/Platelet -     Lipid panel  Obesity, Class II, BMI 35-39.9 -     CMP14+EGFR -     CBC with Differential/Platelet -     Lipid panel -     TSH  Vitamin D deficiency -     VITAMIN D 25 Hydroxy (Vit-D Deficiency, Fractures)  GAD (generalized anxiety disorder) -     TSH   Controlled substance contract signed. Labs pending. Diet and exercise encouraged. Report any new or worsening symptoms.   Continue all other maintenance medications.  Follow up plan: Return in about 3 months (around 12/16/2018), or if symptoms worsen or fail to improve.  Educational handout given for insomnia  The above assessment and management plan was discussed with the patient. The patient verbalized understanding of and has agreed to the management plan. Patient is aware to call the clinic if symptoms persist or worsen. Patient is aware when to return to the  clinic for a follow-up visit. Patient educated on when it is appropriate to go to the emergency department.   Monia Pouch, FNP-C [facility] [phone]

## 2018-09-18 LAB — CBC WITH DIFFERENTIAL/PLATELET
Basophils Absolute: 0.1 10*3/uL (ref 0.0–0.2)
Basos: 1 %
EOS (ABSOLUTE): 0.4 10*3/uL (ref 0.0–0.4)
Eos: 5 %
Hematocrit: 43.6 % (ref 34.0–46.6)
Hemoglobin: 15.4 g/dL (ref 11.1–15.9)
IMMATURE GRANS (ABS): 0 10*3/uL (ref 0.0–0.1)
IMMATURE GRANULOCYTES: 0 %
LYMPHS: 42 %
Lymphocytes Absolute: 3.4 10*3/uL — ABNORMAL HIGH (ref 0.7–3.1)
MCH: 31.9 pg (ref 26.6–33.0)
MCHC: 35.3 g/dL (ref 31.5–35.7)
MCV: 90 fL (ref 79–97)
Monocytes Absolute: 0.6 10*3/uL (ref 0.1–0.9)
Monocytes: 7 %
NEUTROS PCT: 45 %
Neutrophils Absolute: 3.7 10*3/uL (ref 1.4–7.0)
PLATELETS: 225 10*3/uL (ref 150–450)
RBC: 4.83 x10E6/uL (ref 3.77–5.28)
RDW: 11.7 % (ref 11.7–15.4)
WBC: 8.1 10*3/uL (ref 3.4–10.8)

## 2018-09-18 LAB — CMP14+EGFR
A/G RATIO: 1.9 (ref 1.2–2.2)
ALT: 15 IU/L (ref 0–32)
AST: 15 IU/L (ref 0–40)
Albumin: 4.3 g/dL (ref 3.8–4.9)
Alkaline Phosphatase: 88 IU/L (ref 39–117)
BUN/Creatinine Ratio: 27 — ABNORMAL HIGH (ref 9–23)
BUN: 17 mg/dL (ref 6–24)
Bilirubin Total: 0.5 mg/dL (ref 0.0–1.2)
CO2: 26 mmol/L (ref 20–29)
CREATININE: 0.62 mg/dL (ref 0.57–1.00)
Calcium: 9.7 mg/dL (ref 8.7–10.2)
Chloride: 100 mmol/L (ref 96–106)
GFR calc Af Amer: 120 mL/min/{1.73_m2} (ref >59)
GFR, EST NON AFRICAN AMERICAN: 104 mL/min/{1.73_m2} (ref >59)
GLUCOSE: 94 mg/dL (ref 65–99)
Globulin, Total: 2.3 g/dL (ref 1.5–4.5)
POTASSIUM: 4.1 mmol/L (ref 3.5–5.2)
Sodium: 141 mmol/L (ref 134–144)
Total Protein: 6.6 g/dL (ref 6.0–8.5)

## 2018-09-18 LAB — LIPID PANEL
CHOLESTEROL TOTAL: 209 mg/dL — AB (ref 100–199)
Chol/HDL Ratio: 6.1 ratio — ABNORMAL HIGH (ref 0.0–4.4)
HDL: 34 mg/dL — AB (ref >39)
LDL Calculated: 107 mg/dL — ABNORMAL HIGH (ref 0–99)
Triglycerides: 341 mg/dL — ABNORMAL HIGH (ref 0–149)
VLDL Cholesterol Cal: 68 mg/dL — ABNORMAL HIGH (ref 5–40)

## 2018-09-18 LAB — TSH: TSH: 1.42 u[IU]/mL (ref 0.450–4.500)

## 2018-09-18 LAB — VITAMIN D 25 HYDROXY (VIT D DEFICIENCY, FRACTURES): Vit D, 25-Hydroxy: 27.2 ng/mL — ABNORMAL LOW (ref 30.0–100.0)

## 2018-09-18 MED ORDER — ATORVASTATIN CALCIUM 20 MG PO TABS: 20.0000 mg | ORAL_TABLET | Freq: Every day | ORAL | 3 refills | Status: DC

## 2018-09-18 NOTE — Addendum Note (Signed)
Addended by: Sonny MastersAKES, Neils [place] M on: 09/18/2018 09:17 AM   Modules accepted: Orders

## 2018-09-22 ENCOUNTER — Telehealth: Payer: Self-pay | Admitting: Family Medicine

## 2018-09-29 ENCOUNTER — Ambulatory Visit: Payer: Managed Care, Other (non HMO) | Admitting: Nurse Practitioner

## 2018-10-15 ENCOUNTER — Ambulatory Visit (INDEPENDENT_AMBULATORY_CARE_PROVIDER_SITE_OTHER): Payer: Managed Care, Other (non HMO) | Admitting: Family Medicine

## 2018-10-15 ENCOUNTER — Encounter: Payer: Self-pay | Admitting: Family Medicine

## 2018-10-15 VITALS — BP 127/80 | HR 63 | Temp 97.9°F | Ht 64.0 in | Wt 211.0 lb

## 2018-10-15 DIAGNOSIS — Z716 Tobacco abuse counseling: Secondary | ICD-10-CM

## 2018-10-15 MED ORDER — VARENICLINE TARTRATE 0.5 MG X 11 & 1 MG X 42 PO MISC: ORAL | 0 refills | Status: DC

## 2018-10-15 NOTE — Patient Instructions (Signed)
Coping with Quitting Smoking  Quitting smoking is a physical and mental challenge. You will face cravings, withdrawal symptoms, and temptation. Before quitting, work with your health care provider to make a plan that can help you cope. Preparation can help you quit and keep you from giving in. How can I cope with cravings? Cravings usually last for 5-10 minutes. If you get through it, the craving will pass. Consider taking the following actions to help you cope with cravings:  Keep your mouth busy: ? Chew sugar-free gum. ? Suck on hard candies or a straw. ? Brush your teeth.  Keep your hands and body busy: ? Immediately change to a different activity when you feel a craving. ? Squeeze or play with a ball. ? Do an activity or a hobby, like making bead jewelry, practicing needlepoint, or working with wood. ? Mix up your normal routine. ? Take a short exercise break. Go for a quick walk or run up and down stairs. ? Spend time in public places where smoking is not allowed.  Focus on doing something kind or helpful for someone else.  Call a friend or family member to talk during a craving.  Join a support group.  Call a quit line, such as 1-800-QUIT-NOW.  Talk with your health care provider about medicines that might help you cope with cravings and make quitting easier for you. How can I deal with withdrawal symptoms? Your body may experience negative effects as it tries to get used to not having nicotine in the system. These effects are called withdrawal symptoms. They may include:  Feeling hungrier than normal.  Trouble concentrating.  Irritability.  Trouble sleeping.  Feeling depressed.  Restlessness and agitation.  Craving a cigarette. To manage withdrawal symptoms:  Avoid places, people, and activities that trigger your cravings.  Remember why you want to quit.  Get plenty of sleep.  Avoid coffee and other caffeinated drinks. These may worsen some of your symptoms.  How can I handle social situations? Social situations can be difficult when you are quitting smoking, especially in the first few weeks. To manage this, you can:  Avoid parties, bars, and other social situations where people might be smoking.  Avoid alcohol.  Leave right away if you have the urge to smoke.  Explain to your family and friends that you are quitting smoking. Ask for understanding and support.  Plan activities with friends or family where smoking is not an option. What are some ways I can cope with stress? Wanting to smoke may cause stress, and stress can make you want to smoke. Find ways to manage your stress. Relaxation techniques can help. For example:  Breathe slowly and deeply, in through your nose and out through your mouth.  Listen to soothing, relaxing music.  Talk with a family member or friend about your stress.  Light a candle.  Soak in a bath or take a shower.  Think about a peaceful place. What are some ways I can prevent weight gain? Be aware that many people gain weight after they quit smoking. However, not everyone does. To keep from gaining weight, have a plan in place before you quit and stick to the plan after you quit. Your plan should include:  Having healthy snacks. When you have a craving, it may help to: ? Eat plain popcorn, crunchy carrots, celery, or other cut vegetables. ? Chew sugar-free gum.  Changing how you eat: ? Eat small portion sizes at meals. ? Eat 4-6 small meals   throughout the day instead of 1-2 large meals a day. ? Be mindful when you eat. Do not watch television or do other things that might distract you as you eat.  Exercising regularly: ? Make time to exercise each day. If you do not have time for a long workout, do short bouts of exercise for 5-10 minutes several times a day. ? Do some form of strengthening exercise, like weight lifting, and some form of aerobic exercise, like running or swimming.  Drinking plenty of  water or other low-calorie or no-calorie drinks. Drink 6-8 glasses of water daily, or as much as instructed by your health care provider. Summary  Quitting smoking is a physical and mental challenge. You will face cravings, withdrawal symptoms, and temptation to smoke again. Preparation can help you as you go through these challenges.  You can cope with cravings by keeping your mouth busy (such as by chewing gum), keeping your body and hands busy, and making calls to family, friends, or a helpline for people who want to quit smoking.  You can cope with withdrawal symptoms by avoiding places where people smoke, avoiding drinks with caffeine, and getting plenty of rest.  Ask your health care provider about the different ways to prevent weight gain, avoid stress, and handle social situations. This information is not intended to replace advice given to you by your health care provider. Make sure you discuss any questions you have with your health care provider. Document Released: 08/01/2016 Document Revised: 08/01/2016 Document Reviewed: 08/01/2016 Elsevier Interactive Patient Education  2019 [employer]  

## 2018-10-15 NOTE — Progress Notes (Signed)
Subjective:  Patient ID: Leslie Abbott, female    DOB: 03-May-1966, 53 y.o.   MRN: [id number]  Chief Complaint:  Nicotine Dependence (would like to try Chantix)   HPI: Leslie Abbott is a 53 y.o. female presenting on 10/15/2018 for Nicotine Dependence (would like to try Chantix)   1. Encounter for tobacco use cessation counseling   Pt presents today for smoking cessation. Pt states she started smoking when she was 15 or 16. States she smokes 1 PPD. States she has tried to quit in the past without success. Pt states she tried Chantix but did not continue therapy. States she is ready and willing to quit smoking now.    Relevant past medical, surgical, family, and social history reviewed and updated as indicated.  Allergies and medications reviewed and updated.   Past Medical History:  Diagnosis Date  . Anxiety   . Depression     Past Surgical History:  Procedure Laterality Date  . birth mark     birth mark removal 40 years ago  . TUBAL LIGATION      Social History   Socioeconomic History  . Marital status: Married    Spouse name: Not on file  . Number of children: Not on file  . Years of education: Not on file  . Highest education level: Not on file  Occupational History  . Not on file  Social Needs  . Financial resource strain: Not on file  . Food insecurity:    Worry: Not on file    Inability: Not on file  . Transportation needs:    Medical: Not on file    Non-medical: Not on file  Tobacco Use  . Smoking status: Former Smoker    Packs/day: 1.00    Years: 20.00    Pack years: 20.00    Types: Cigarettes  . Smokeless tobacco: Current User  Substance and Sexual Activity  . Alcohol use: Yes    Alcohol/week: 3.0 standard drinks    Types: 3 Cans of beer per week  . Drug use: No  . Sexual activity: Not on file  Lifestyle  . Physical activity:    Days per week: Not on file    Minutes per session: Not on file  . Stress: Not on file  Relationships  . Social  connections:    Talks on phone: Not on file    Gets together: Not on file    Attends religious service: Not on file    Active member of club or organization: Not on file    Attends meetings of clubs or organizations: Not on file    Relationship status: Not on file  . Intimate partner violence:    Fear of current or ex partner: Not on file    Emotionally abused: Not on file    Physically abused: Not on file    Forced sexual activity: Not on file  Other Topics Concern  . Not on file  Social History Narrative  . Not on file    Outpatient Encounter Medications as of 10/15/2018  Medication Sig  . Biotin 5000 MCG TABS Take 1 tablet by mouth daily.  . busPIRone (BUSPAR) 10 MG tablet Take 1 tablet (10 mg total) by mouth 2 (two) times daily.  Marland Kitchen escitalopram (LEXAPRO) 20 MG tablet Take 1 tablet (20 mg total) by mouth daily.  . Misc Natural Products (OSTEO BI-FLEX TRIPLE STRENGTH PO) Take 2 tablets by mouth daily.  Marland Kitchen zolpidem (AMBIEN) 10 MG tablet Take  1 tablet (10 mg total) by mouth at bedtime as needed for sleep.  . varenicline (CHANTIX PAK) 0.5 MG X 11 & 1 MG X 42 tablet Take one 0.5 mg tablet by mouth once daily for 3 days, then increase to one 0.5 mg tablet twice daily for 4 days, then increase to one 1 mg tablet twice daily.  . [DISCONTINUED] atorvastatin (LIPITOR) 20 MG tablet Take 1 tablet (20 mg total) by mouth daily. (Patient not taking: Reported on 10/15/2018)   No facility-administered encounter medications on file as of 10/15/2018.     No Known Allergies  Review of Systems  Respiratory: Positive for cough. Negative for shortness of breath.   Cardiovascular: Negative for chest pain and palpitations.  All other systems reviewed and are negative.       Objective:  BP 127/80   Pulse 63   Temp 97.9 F (36.6 C) (Oral)   Ht 5\' 4"  (1.626 m)   Wt 211 lb (95.7 kg)   BMI 36.22 kg/m    Wt Readings from Last 3 Encounters:  10/15/18 211 lb (95.7 kg)  09/17/18 210 lb (95.3 kg)    06/25/18 219 lb 9.6 oz (99.6 kg)    Physical Exam Vitals signs and nursing note reviewed.  Constitutional:      General: She is not in acute distress.    Appearance: Normal appearance. She is not ill-appearing or toxic-appearing.  HENT:     Head: Normocephalic and atraumatic.  Eyes:     Conjunctiva/sclera: Conjunctivae normal.     Pupils: Pupils are equal, round, and reactive to light.  Cardiovascular:     Rate and Rhythm: Normal rate and regular rhythm.     Heart sounds: Normal heart sounds.  Pulmonary:     Effort: Pulmonary effort is normal. No respiratory distress.     Breath sounds: Normal breath sounds.  Skin:    Capillary Refill: Capillary refill takes less than 2 seconds.  Neurological:     General: No focal deficit present.     Mental Status: She is alert and oriented to person, place, and time.  Psychiatric:        Mood and Affect: Mood normal.        Behavior: Behavior normal.        Thought Content: Thought content normal.        Judgment: Judgment normal.     Results for orders placed or performed in visit on 09/22/18  Cologuard  Result Value Ref Range   Cologuard Negative Negative       Pertinent labs & imaging results that were available during my care of the patient were reviewed by me and considered in my medical decision making.  Assessment & Plan:  [name] was seen today for nicotine dependence.  Diagnoses and all orders for this visit:  Encounter for tobacco use cessation counseling Counseling provided. Discussed plan for cessation. Chantix starter pack prescribed. Return in 4-6 weeks for reevaluation.  -     varenicline (CHANTIX PAK) 0.5 MG X 11 & 1 MG X 42 tablet; Take one 0.5 mg tablet by mouth once daily for 3 days, then increase to one 0.5 mg tablet twice daily for 4 days, then increase to one 1 mg tablet twice daily.     Continue all other maintenance medications.  Follow up plan: Return in about 4 weeks (around 11/12/2018), or if  symptoms worsen or fail to improve.  Educational handout given for smoking cessation  The above assessment and  management plan was discussed with the patient. The patient verbalized understanding of and has agreed to the management plan. Patient is aware to call the clinic if symptoms persist or worsen. Patient is aware when to return to the clinic for a follow-up visit. Patient educated on when it is appropriate to go to the emergency department.   Michelle Shavonda Wiedman, FNP-C [facility] [phone]  

## 2018-10-29 ENCOUNTER — Encounter: Payer: Self-pay | Admitting: Family Medicine

## 2018-10-29 ENCOUNTER — Ambulatory Visit (INDEPENDENT_AMBULATORY_CARE_PROVIDER_SITE_OTHER): Payer: Managed Care, Other (non HMO) | Admitting: Family Medicine

## 2018-10-29 ENCOUNTER — Other Ambulatory Visit: Payer: Self-pay

## 2018-10-29 VITALS — BP 124/81 | HR 66 | Temp 97.0°F | Ht 64.0 in | Wt 211.0 lb

## 2018-10-29 DIAGNOSIS — S161XXA Strain of muscle, fascia and tendon at neck level, initial encounter: Secondary | ICD-10-CM | POA: Diagnosis not present

## 2018-10-29 MED ORDER — DICLOFENAC SODIUM 1 % TD GEL: 2.0000 g | Freq: Four times a day (QID) | TRANSDERMAL | 1 refills | Status: AC

## 2018-10-29 NOTE — Patient Instructions (Signed)
Cervical Strain and Sprain Rehab Ask your health care provider which exercises are safe for you. Do exercises exactly as told by your health care provider and adjust them as directed. It is normal to feel mild stretching, pulling, tightness, or discomfort as you do these exercises, but you should stop right away if you feel sudden pain or your pain gets worse.Do not begin these exercises until told by your health care provider. Stretching and range of motion exercises These exercises warm up your muscles and joints and improve the movement and flexibility of your neck. These exercises also help to relieve pain, numbness, and tingling. Exercise A: Cervical side bend  1. Using good posture, sit on a stable chair or stand up. 2. Without moving your shoulders, slowly tilt your left / right ear to your shoulder until you feel a stretch in your neck muscles. You should be looking straight ahead. 3. Hold for __________ seconds. 4. Repeat with the other side of your neck. Repeat __________ times. Complete this exercise __________ times a day. Exercise B: Cervical rotation  1. Using good posture, sit on a stable chair or stand up. 2. Slowly turn your head to the side as if you are looking over your left / right shoulder. ? Keep your eyes level with the ground. ? Stop when you feel a stretch along the side and the back of your neck. 3. Hold for __________ seconds. 4. Repeat this by turning to your other side. Repeat __________ times. Complete this exercise __________ times a day. Exercise C: Thoracic extension and pectoral stretch 1. Roll a towel or a small blanket so it is about 4 inches (10 cm) in diameter. 2. Lie down on your back on a firm surface. 3. Put the towel lengthwise, under your spine in the middle of your back. It should not be not under your shoulder blades. The towel should line up with your spine from your middle back to your lower back. 4. Put your hands behind your head and let your  elbows fall out to your sides. 5. Hold for __________ seconds. Repeat __________ times. Complete this exercise __________ times a day. Strengthening exercises These exercises build strength and endurance in your neck. Endurance is the ability to use your muscles for a long time, even after your muscles get tired. Exercise D: Upper cervical flexion, isometric 1. Lie on your back with a thin pillow behind your head and a small rolled-up towel under your neck. 2. Gently tuck your chin toward your chest and nod your head down to look toward your feet. Do not lift your head off the pillow. 3. Hold for __________ seconds. 4. Release the tension slowly. Relax your neck muscles completely before you repeat this exercise. Repeat __________ times. Complete this exercise __________ times a day. Exercise E: Cervical extension, isometric  1. Stand about 6 inches (15 cm) away from a wall, with your back facing the wall. 2. Place a soft object, about 6-8 inches (15-20 cm) in diameter, between the back of your head and the wall. A soft object could be a small pillow, a ball, or a folded towel. 3. Gently tilt your head back and press into the soft object. Keep your jaw and forehead relaxed. 4. Hold for __________ seconds. 5. Release the tension slowly. Relax your neck muscles completely before you repeat this exercise. Repeat __________ times. Complete this exercise __________ times a day. Posture and body mechanics Body mechanics refers to the movements and positions of your   body while you do your daily activities. Posture is part of body mechanics. Good posture and healthy body mechanics can help to relieve stress in your body's tissues and joints. Good posture means that your spine is in its natural S-curve position (your spine is neutral), your shoulders are pulled back slightly, and your head is not tipped forward. The following are general guidelines for applying improved posture and body mechanics to your  everyday activities. Standing   When standing, keep your spine neutral and keep your feet about hip-width apart. Keep a slight bend in your knees. Your ears, shoulders, and hips should line up.  When you do a task in which you stand in one place for a long time, place one foot up on a stable object that is 2-4 inches (5-10 cm) high, such as a footstool. This helps keep your spine neutral. Sitting   When sitting, keep your spine neutral and your keep feet flat on the floor. Use a footrest, if necessary, and keep your thighs parallel to the floor. Avoid rounding your shoulders, and avoid tilting your head forward.  When working at a desk or a computer, keep your desk at a height where your hands are slightly lower than your elbows. Slide your chair under your desk so you are close enough to maintain good posture.  When working at a computer, place your monitor at a height where you are looking straight ahead and you do not have to tilt your head forward or downward to look at the screen. Resting When lying down and resting, avoid positions that are most painful for you. Try to support your neck in a neutral position. You can use a contour pillow or a small rolled-up towel. Your pillow should support your neck but not push on it. This information is not intended to replace advice given to you by your health care provider. Make sure you discuss any questions you have with your health care provider. Document Released: 08/04/2005 Document Revised: 04/10/2016 Document Reviewed: 07/11/2015 Elsevier Interactive Patient Education  2019 [employer]  

## 2018-10-29 NOTE — Progress Notes (Signed)
Subjective:  Patient ID: Leslie Abbott, female    DOB: [birth year], 53 y.o.   MRN: [id number]  Chief Complaint:  Pain and stiffness in left side of neck x 2 weeks   HPI: Leslie Abbott is a 53 y.o. female presenting on 10/29/2018 for Pain and stiffness in left side of neck x 2 weeks  Pt presents today for left neck stiffness. Pt states this started around 2 weeks ago. She states the pain is worse at night and first thing in the morning. States it is stiff and aching, 8/10. She has tried over the counter tylenol and motrin without relief of symptoms. She has not tired ice or heat. No injury, numbness, tingling, weakness, or loss of function.   Relevant past medical, surgical, family, and social history reviewed and updated as indicated.  Allergies and medications reviewed and updated.   Past Medical History:  Diagnosis Date  . Anxiety   . Depression     Past Surgical History:  Procedure Laterality Date  . birth mark     birth mark removal 40 years ago  . TUBAL LIGATION      Social History   Socioeconomic History  . Marital status: Married    Spouse name: Not on file  . Number of children: Not on file  . Years of education: Not on file  . Highest education level: Not on file  Occupational History  . Not on file  Social Needs  . Financial resource strain: Not on file  . Food insecurity:    Worry: Not on file    Inability: Not on file  . Transportation needs:    Medical: Not on file    Non-medical: Not on file  Tobacco Use  . Smoking status: Former Smoker    Packs/day: 1.00    Years: 35.00    Pack years: 35.00    Types: Cigarettes  . Smokeless tobacco: Current User  Substance and Sexual Activity  . Alcohol use: Yes    Alcohol/week: 3.0 standard drinks    Types: 3 Cans of beer per week  . Drug use: No  . Sexual activity: Not on file  Lifestyle  . Physical activity:    Days per week: Not on file    Minutes per session: Not on file  . Stress: Not on file   Relationships  . Social connections:    Talks on phone: Not on file    Gets together: Not on file    Attends religious service: Not on file    Active member of club or organization: Not on file    Attends meetings of clubs or organizations: Not on file    Relationship status: Not on file  . Intimate partner violence:    Fear of current or ex partner: Not on file    Emotionally abused: Not on file    Physically abused: Not on file    Forced sexual activity: Not on file  Other Topics Concern  . Not on file  Social History Narrative  . Not on file    Outpatient Encounter Medications as of 10/29/2018  Medication Sig  . Biotin 5000 MCG TABS Take 1 tablet by mouth daily.  . busPIRone (BUSPAR) 10 MG tablet Take 1 tablet (10 mg total) by mouth 2 (two) times daily.  Marland Kitchen escitalopram (LEXAPRO) 20 MG tablet Take 1 tablet (20 mg total) by mouth daily.  . Misc Natural Products (OSTEO BI-FLEX TRIPLE STRENGTH PO) Take 2 tablets by mouth  daily.  . varenicline (CHANTIX PAK) 0.5 MG X 11 & 1 MG X 42 tablet Take one 0.5 mg tablet by mouth once daily for 3 days, then increase to one 0.5 mg tablet twice daily for 4 days, then increase to one 1 mg tablet twice daily.  Marland Kitchen zolpidem (AMBIEN) 10 MG tablet Take 1 tablet (10 mg total) by mouth at bedtime as needed for sleep.  Marland Kitchen diclofenac sodium (VOLTAREN) 1 % GEL Apply 2 g topically 4 (four) times daily for 30 days.   No facility-administered encounter medications on file as of 10/29/2018.     No Known Allergies  Review of Systems  Constitutional: Negative for chills, fatigue and fever.  Respiratory: Negative for cough and shortness of breath.   Cardiovascular: Negative for chest pain, palpitations and leg swelling.  Musculoskeletal: Positive for neck stiffness (left sided). Negative for arthralgias, back pain, gait problem, joint swelling, myalgias and neck pain.  Neurological: Negative for tremors, weakness and numbness.  Psychiatric/Behavioral: Negative  for confusion.  All other systems reviewed and are negative.       Objective:  BP 124/81   Pulse 66   Temp (!) 97 F (36.1 C) (Oral)   Ht  (1.626 m)   Wt 211 lb (95.7 kg)   BMI 36.22 kg/m    Wt Readings from Last 3 Encounters:  10/29/18 211 lb (95.7 kg)  10/15/18 211 lb (95.7 kg)  09/17/18 210 lb (95.3 kg)    Physical Exam Vitals signs and nursing note reviewed.  Constitutional:      General: She is not in acute distress.    Appearance: Normal appearance. She is not ill-appearing or toxic-appearing.  HENT:     Head: Normocephalic and atraumatic.     Mouth/Throat:     Pharynx: Oropharynx is clear.  Eyes:     Conjunctiva/sclera: Conjunctivae normal.     Pupils: Pupils are equal, round, and reactive to light.  Neck:     Musculoskeletal: Neck supple. Pain with movement (pain with rotation) and muscular tenderness (left sided) present. No spinous process tenderness.     Thyroid: No thyroid mass, thyromegaly or thyroid tenderness.     Vascular: No carotid bruit or JVD.     Trachea: Trachea and phonation normal.   Cardiovascular:     Rate and Rhythm: Normal rate and regular rhythm.     Pulses: Normal pulses.     Heart sounds: Normal heart sounds. No murmur. No friction rub. No gallop.   Pulmonary:     Effort: Pulmonary effort is normal. No respiratory distress.     Breath sounds: Normal breath sounds.  Lymphadenopathy:     Cervical: No cervical adenopathy.  Skin:    General: Skin is warm and dry.     Capillary Refill: Capillary refill takes less than 2 seconds.  Neurological:     General: No focal deficit present.     Mental Status: She is alert and oriented to person, place, and time.     Cranial Nerves: No cranial nerve deficit.     Sensory: No sensory deficit.     Motor: No weakness.     Coordination: Coordination normal.     Gait: Gait normal.     Deep Tendon Reflexes: Reflexes normal.  Psychiatric:        Mood and Affect: Mood normal.        Behavior:  Behavior normal. Behavior is cooperative.        Thought Content: Thought content normal.  Judgment: Judgment normal.     Results for orders placed or performed in visit on 09/22/18  Cologuard  Result Value Ref Range   Cologuard Negative Negative       Pertinent labs & imaging results that were available during my care of the patient were reviewed by me and considered in my medical decision making.  Assessment & Plan:  Marlisha was seen today for pain and stiffness in left side of neck x 2 weeks.  Diagnoses and all orders for this visit:  Strain of neck muscle, initial encounter No focal deficits. No paresthesias. No red flags. Unable to treat with muscle relaxant due to [place]. Will trial topical NSAID. Can use over the counter topicals and moist heat. If symptoms persist, may need referral to PT. Pt advised to get a new pillow.  -     diclofenac sodium (VOLTAREN) 1 % GEL; Apply 2 g topically 4 (four) times daily for 30 days.     Continue all other maintenance medications.  Follow up plan: Return if symptoms worsen or fail to improve.  Educational handout given for cervical strain and sprain rehab  The above assessment and management plan was discussed with the patient. The patient verbalized understanding of and has agreed to the management plan. Patient is aware to call the clinic if symptoms persist or worsen. Patient is aware when to return to the clinic for a follow-up visit. Patient educated on when it is appropriate to go to the emergency department.   Kari Baars, FNP-C [facility] [phone]

## 2018-12-14 ENCOUNTER — Ambulatory Visit: Payer: Managed Care, Other (non HMO) | Admitting: Family Medicine

## 2019-02-07 ENCOUNTER — Telehealth: Payer: Self-pay | Admitting: Family Medicine

## 2019-03-04 ENCOUNTER — Other Ambulatory Visit: Payer: Self-pay | Admitting: Family Medicine

## 2019-03-04 ENCOUNTER — Other Ambulatory Visit: Payer: Self-pay

## 2019-03-04 DIAGNOSIS — G4700 Insomnia, unspecified: Secondary | ICD-10-CM

## 2019-03-04 DIAGNOSIS — F411 Generalized anxiety disorder: Secondary | ICD-10-CM

## 2019-03-05 MED ORDER — ESCITALOPRAM OXALATE 20 MG PO TABS: 20.0000 mg | ORAL_TABLET | Freq: Every day | ORAL | 0 refills | Status: DC

## 2019-03-05 NOTE — Telephone Encounter (Signed)
Needs appointment for additional refills

## 2019-03-23 ENCOUNTER — Other Ambulatory Visit: Payer: Self-pay | Admitting: Family Medicine

## 2019-03-23 ENCOUNTER — Encounter: Payer: Self-pay | Admitting: Family Medicine

## 2019-03-23 ENCOUNTER — Other Ambulatory Visit: Payer: Self-pay

## 2019-03-23 ENCOUNTER — Ambulatory Visit (INDEPENDENT_AMBULATORY_CARE_PROVIDER_SITE_OTHER): Payer: Managed Care, Other (non HMO) | Admitting: Family Medicine

## 2019-03-23 VITALS — BP 140/96 | HR 71 | Temp 97.8°F | Ht 64.0 in | Wt 204.0 lb

## 2019-03-23 DIAGNOSIS — E782 Mixed hyperlipidemia: Secondary | ICD-10-CM | POA: Diagnosis not present

## 2019-03-23 DIAGNOSIS — E559 Vitamin D deficiency, unspecified: Secondary | ICD-10-CM | POA: Diagnosis not present

## 2019-03-23 DIAGNOSIS — Z79899 Other long term (current) drug therapy: Secondary | ICD-10-CM

## 2019-03-23 DIAGNOSIS — E669 Obesity, unspecified: Secondary | ICD-10-CM | POA: Diagnosis not present

## 2019-03-23 DIAGNOSIS — M25512 Pain in left shoulder: Secondary | ICD-10-CM

## 2019-03-23 DIAGNOSIS — F411 Generalized anxiety disorder: Secondary | ICD-10-CM

## 2019-03-23 DIAGNOSIS — G8929 Other chronic pain: Secondary | ICD-10-CM

## 2019-03-23 DIAGNOSIS — Z9114 Patient's other noncompliance with medication regimen: Secondary | ICD-10-CM

## 2019-03-23 DIAGNOSIS — G4701 Insomnia due to medical condition: Secondary | ICD-10-CM

## 2019-03-23 MED ORDER — ZOLPIDEM TARTRATE 10 MG PO TABS: 10.0000 mg | ORAL_TABLET | Freq: Every evening | ORAL | 5 refills | Status: DC | PRN

## 2019-03-23 MED ORDER — DICLOFENAC SODIUM 1 % TD GEL: 2.0000 g | Freq: Four times a day (QID) | TRANSDERMAL | 0 refills | Status: DC

## 2019-03-23 NOTE — Patient Instructions (Signed)
Shoulder Pain Many things can cause shoulder pain, including:  An injury.  Moving the shoulder in the same way again and again (overuse).  Joint pain (arthritis). Pain can come from:  Swelling and irritation (inflammation) of any part of the shoulder.  An injury to the shoulder joint.  An injury to: ? Tissues that connect muscle to bone (tendons). ? Tissues that connect bones to each other (ligaments). ? Bones. Follow these instructions at home: Watch for changes in your symptoms. Let your doctor know about them. Follow these instructions to help with your pain. If you have a sling:  Wear the sling as told by your doctor. Remove it only as told by your doctor.  Loosen the sling if your fingers: ? Tingle. ? Become numb. ? Turn cold and blue.  Keep the sling clean.  If the sling is not waterproof: ? Do not let it get wet. ? Take the sling off when you shower or bathe. Managing pain, stiffness, and swelling   If told, put ice on the painful area: ? Put ice in a plastic bag. ? Place a towel between your skin and the bag. ? Leave the ice on for 20 minutes, 2-3 times a day. Stop putting ice on if it does not help with the pain.  Squeeze a soft ball or a foam pad as much as possible. This prevents swelling in the shoulder. It also helps to strengthen the arm. General instructions  Take over-the-counter and prescription medicines only as told by your doctor.  Keep all follow-up visits as told by your doctor. This is important. Contact a doctor if:  Your pain gets worse.  Medicine does not help your pain.  You have new pain in your arm, hand, or fingers. Get help right away if:  Your arm, hand, or fingers: ? Tingle. ? Are numb. ? Are swollen. ? Are painful. ? Turn white or blue. Summary  Shoulder pain can be caused by many things. These include injury, moving the shoulder in the same away again and again, and joint pain.  Watch for changes in your symptoms.  Let your doctor know about them.  This condition may be treated with a sling, ice, and pain medicine.  Contact your doctor if the pain gets worse or you have new pain. Get help right away if your arm, hand, or fingers tingle or get numb, swollen, or painful.  Keep all follow-up visits as told by your doctor. This is important. This information is not intended to replace advice given to you by your health care provider. Make sure you discuss any questions you have with your health care provider. Document Released: 01/21/2008 Document Revised: 02/16/2018 Document Reviewed: 02/16/2018 Elsevier Patient Education  2020 [employer]  

## 2019-03-23 NOTE — Progress Notes (Signed)
Subjective:  Patient ID: Leslie Abbott, female    DOB: [birth year], 53 y.o.   MRN: [id number]  Patient Care Team: Baruch Gouty, FNP as PCP - General (Family Medicine)   Chief Complaint:  Medical Management of Chronic Issues and refills   HPI: Leslie Abbott is a 53 y.o. female presenting on 03/23/2019 for Medical Management of Chronic Issues and refills  1. GAD (generalized anxiety disorder)  Ongoing anxiety. Was well controlled with Buspar and Lexapro. Has had a slight increase in symptoms since the onset of the COVID-19 pandemic, but able to manage with coping mechanisms. No associated side effects from medications.   GAD 7 : Generalized Anxiety Score 03/23/2019 11/27/2015  Nervous, Anxious, on Edge 1 3  Control/stop worrying 1 3  Worry too much - different things 1 3  Trouble relaxing 0 3  Restless 0 3  Easily annoyed or irritable 1 3  Afraid - awful might happen 1 1  Total GAD 7 Score 5 19  Anxiety Difficulty - Very difficult     2. Mixed hyperlipidemia  Diet controlled. Pt has not been on statin in a long time.  Diet - tries to watch what she eats, does not always eat healthy.  Exercise - does not exercise on a regular basis.   Lab Results  Component Value Date   CHOL 209 (H) 09/17/2018   HDL 34 (L) 09/17/2018   LDLCALC 107 (H) 09/17/2018   TRIG 341 (H) 09/17/2018   CHOLHDL 6.1 (H) 09/17/2018     Family and personal medical history reviewed. Smoking and ETOH history reviewed.    3. Vitamin D deficiency  Pt is not taking oral repletion therapy. Denies bone pain and tenderness, muscle weakness, fracture, and difficulty walking. Does have right shoulder pain from a fall several days ago. Lab Results  Component Value Date   VD25OH 27.2 (L) 09/17/2018   VD25OH 30.2 05/25/2017   Lab Results  Component Value Date   CALCIUM 9.7 09/17/2018      4. Chronic left shoulder pain  Ongoing for 6 weeks. States she feel in June. States she has continued aching in her left  shoulder. No loss of function, weakness, numbness, or tingling. No swelling of the joint.    5. Insomnia Caused by anxiety and excessive worry. Does take Ambien to facilitate sleep. States she only takes the Ambien on nights when she has to work. States she does not take when she is off.  No associated side effects.    6. Controlled substance agreement signed  Updated contract today for Ambien.      Relevant past medical, surgical, family, and social history reviewed and updated as indicated.  Allergies and medications reviewed and updated. Date reviewed: Chart in Epic.   Past Medical History:  Diagnosis Date  . Anxiety   . Depression     Past Surgical History:  Procedure Laterality Date  . birth mark     birth mark removal 40 years ago  . TUBAL LIGATION      Social History   Socioeconomic History  . Marital status: Married    Spouse name: Not on file  . Number of children: Not on file  . Years of education: Not on file  . Highest education level: Not on file  Occupational History  . Not on file  Social Needs  . Financial resource strain: Not on file  . Food insecurity    Worry: Not on file    Inability:  Not on file  . Transportation needs    Medical: Not on file    Non-medical: Not on file  Tobacco Use  . Smoking status: Former Smoker    Packs/day: 1.00    Years: 35.00    Pack years: 35.00    Types: Cigarettes  . Smokeless tobacco: Current User  Substance and Sexual Activity  . Alcohol use: Yes    Alcohol/week: 3.0 standard drinks    Types: 3 Cans of beer per week  . Drug use: No  . Sexual activity: Not on file  Lifestyle  . Physical activity    Days per week: Not on file    Minutes per session: Not on file  . Stress: Not on file  Relationships  . Social Herbalist on phone: Not on file    Gets together: Not on file    Attends religious service: Not on file    Active member of club or organization: Not on file    Attends meetings of  clubs or organizations: Not on file    Relationship status: Not on file  . Intimate partner violence    Fear of current or ex partner: Not on file    Emotionally abused: Not on file    Physically abused: Not on file    Forced sexual activity: Not on file  Other Topics Concern  . Not on file  Social History Narrative  . Not on file    Outpatient Encounter Medications as of 03/23/2019  Medication Sig  . Biotin 5000 MCG TABS Take 1 tablet by mouth daily.  . busPIRone (BUSPAR) 10 MG tablet Take 1 tablet (10 mg total) by mouth 2 (two) times daily.  . cholecalciferol (VITAMIN D3) 25 MCG (1000 UT) tablet Take 2,000 Units by mouth daily.  Marland Kitchen escitalopram (LEXAPRO) 20 MG tablet Take 1 tablet (20 mg total) by mouth daily.  . Omega 3 1000 MG CAPS Take by mouth.  . Red Yeast Rice Extract (RED YEAST RICE PO) Take by mouth.  . zolpidem (AMBIEN) 10 MG tablet Take 1 tablet (10 mg total) by mouth at bedtime as needed for sleep.  . [DISCONTINUED] diclofenac sodium (VOLTAREN) 1 % GEL APPLY 2 GRAMS TOPICALLY 4 TIMES DAILY FOR 30 DAYS  . [DISCONTINUED] zolpidem (AMBIEN) 10 MG tablet Take 1 tablet (10 mg total) by mouth at bedtime as needed for sleep.  . [DISCONTINUED] zolpidem (AMBIEN) 10 MG tablet Take 1 tablet (10 mg total) by mouth at bedtime as needed for sleep.  Marland Kitchen diclofenac sodium (VOLTAREN) 1 % GEL Apply 2 g topically 4 (four) times daily.  . Misc Natural Products (OSTEO BI-FLEX TRIPLE STRENGTH PO) Take 2 tablets by mouth daily.  . [DISCONTINUED] varenicline (CHANTIX PAK) 0.5 MG X 11 & 1 MG X 42 tablet Take one 0.5 mg tablet by mouth once daily for 3 days, then increase to one 0.5 mg tablet twice daily for 4 days, then increase to one 1 mg tablet twice daily.   No facility-administered encounter medications on file as of 03/23/2019.     No Known Allergies  Review of Systems  Constitutional: Negative for activity change, appetite change, chills, fatigue, fever and unexpected weight change.  HENT:  Negative.   Eyes: Negative.  Negative for photophobia and visual disturbance.  Respiratory: Negative for cough, chest tightness, shortness of breath and wheezing.   Cardiovascular: Negative for chest pain, palpitations and leg swelling.  Gastrointestinal: Negative for abdominal pain, blood in stool, constipation, diarrhea,  nausea and vomiting.  Endocrine: Negative.  Negative for cold intolerance, heat intolerance, polydipsia, polyphagia and polyuria.  Genitourinary: Negative for decreased urine volume, difficulty urinating, dysuria, frequency and urgency.  Musculoskeletal: Positive for arthralgias. Negative for back pain, gait problem, joint swelling, myalgias, neck pain and neck stiffness.  Skin: Negative.  Negative for color change and pallor.  Allergic/Immunologic: Negative.   Neurological: Negative for dizziness, tremors, seizures, syncope, facial asymmetry, speech difficulty, weakness, light-headedness, numbness and headaches.  Hematological: Negative.  Does not bruise/bleed easily.  Psychiatric/Behavioral: Negative for agitation, behavioral problems, confusion, decreased concentration, hallucinations, self-injury, sleep disturbance and suicidal ideas. The patient is nervous/anxious. The patient is not hyperactive.   All other systems reviewed and are negative.       Objective:  BP (!) 140/96   Pulse 71   Temp 97.8 F (36.6 C)   Ht 5' 4"  (1.626 m)   Wt 204 lb (92.5 kg)   BMI 35.02 kg/m    Wt Readings from Last 3 Encounters:  03/23/19 204 lb (92.5 kg)  10/29/18 211 lb (95.7 kg)  10/15/18 211 lb (95.7 kg)    Physical Exam Vitals signs and nursing note reviewed.  Constitutional:      General: She is not in acute distress.    Appearance: Normal appearance. She is well-developed and well-groomed. She is obese. She is not ill-appearing, toxic-appearing or diaphoretic.  HENT:     Head: Normocephalic and atraumatic.     Jaw: There is normal jaw occlusion.     Right Ear: Hearing  normal.     Left Ear: Hearing normal.     Nose: Nose normal.     Mouth/Throat:     Lips: Pink.     Mouth: Mucous membranes are moist.     Pharynx: Oropharynx is clear. Uvula midline.  Eyes:     General: Lids are normal.     Extraocular Movements: Extraocular movements intact.     Conjunctiva/sclera: Conjunctivae normal.     Pupils: Pupils are equal, round, and reactive to light.  Neck:     Musculoskeletal: Normal range of motion and neck supple.     Thyroid: No thyroid mass, thyromegaly or thyroid tenderness.     Vascular: No carotid bruit or JVD.     Trachea: Trachea and phonation normal.  Cardiovascular:     Rate and Rhythm: Normal rate and regular rhythm.     Chest Wall: PMI is not displaced.     Pulses: Normal pulses.     Heart sounds: Normal heart sounds. No murmur. No friction rub. No gallop.   Pulmonary:     Effort: Pulmonary effort is normal. No respiratory distress.     Breath sounds: Normal breath sounds. No wheezing.  Abdominal:     General: Bowel sounds are normal. There is no distension or abdominal bruit.     Palpations: Abdomen is soft. There is no hepatomegaly or splenomegaly.     Tenderness: There is no abdominal tenderness. There is no right CVA tenderness or left CVA tenderness.     Hernia: No hernia is present.  Musculoskeletal: Normal range of motion.     Left shoulder: She exhibits tenderness and pain. She exhibits normal range of motion, no bony tenderness, no swelling, no effusion, no crepitus, no deformity, no laceration, no spasm, normal pulse and normal strength.     Left elbow: Normal.     Cervical back: Normal.     Right lower leg: No edema.     Left  lower leg: No edema.  Lymphadenopathy:     Cervical: No cervical adenopathy.  Skin:    General: Skin is warm and dry.     Capillary Refill: Capillary refill takes less than 2 seconds.     Coloration: Skin is not cyanotic, jaundiced or pale.     Findings: No rash.  Neurological:     General: No  focal deficit present.     Mental Status: She is alert and oriented to person, place, and time.     Cranial Nerves: Cranial nerves are intact.     Sensory: Sensation is intact.     Motor: Motor function is intact.     Coordination: Coordination is intact.     Gait: Gait is intact.     Deep Tendon Reflexes: Reflexes are normal and symmetric.  Psychiatric:        Attention and Perception: Attention and perception normal.        Mood and Affect: Affect normal. Mood is anxious.        Speech: Speech normal.        Behavior: Behavior normal. Behavior is cooperative.        Thought Content: Thought content normal. Thought content does not include homicidal or suicidal ideation. Thought content does not include homicidal or suicidal plan.        Cognition and Memory: Cognition and memory normal.        Judgment: Judgment normal.     Results for orders placed or performed in visit on 09/22/18  Cologuard  Result Value Ref Range   Cologuard Negative Negative       Pertinent labs & imaging results that were available during my care of the patient were reviewed by me and considered in my medical decision making.  Assessment & Plan:  Stormey was seen today for medical management of chronic issues and refills.  Diagnoses and all orders for this visit:  GAD (generalized anxiety disorder) Well controlled with current medications. Report any new or worsening symptoms. Continue current therapy.  -     Thyroid Panel With TSH  Mixed hyperlipidemia Diet encouraged - increase intake of fresh fruits and vegetables, increase intake of lean proteins. Bake, broil, or grill foods. Avoid fried, greasy, and fatty foods. Avoid fast foods. Increase intake of fiber-rich whole grains.  Exercise encouraged - at least 150 minutes per week and advance as tolerated.  Goal BMI < 25.  Will initiate therapy if warranted. Follow up in 3-6 months as discussed.  -     CMP14+EGFR -     Lipid panel  Vitamin D  deficiency Not on repletion therapy. Will check levels today and initiate repletion therapy if warranted.  -     VITAMIN D 25 Hydroxy (Vit-D Deficiency, Fractures)  Obesity, Class II, BMI 35-39.9 Diet and exercise encouraged. BMI recheck at next visit. Labs pending.  -     CMP14+EGFR -     Thyroid Panel With TSH  Chronic left shoulder pain No deformity noted. No loss of function or decreased ROM. Symptomatic care discussed. Rest, ice, diclofenac gel as prescribed. Report any new or worsening symptoms. May need PT if pain continues.  -     diclofenac sodium (VOLTAREN) 1 % GEL; Apply 2 g topically 4 (four) times daily.  Insomnia Controlled substance agreement signed Controlled substance contract updated today. Toxassure collected today. Sleep hygiene discussed. Ambien as needed for sleep.  -     Thyroid Panel With TSH -  zolpidem (AMBIEN) 10 MG tablet; Take 1 tablet (10 mg total) by mouth at bedtime as needed for sleep. -     ToxASSURE Select 13 (MW), Urine   BP elevated in office today, previous BP readings have been normal. Will recheck at next visit. Pt will check at home and report any persistent high readings.   Continue all other maintenance medications.  Follow up plan: Return in about 6 weeks (around 05/04/2019), or if symptoms worsen or fail to improve, for shouder pain.  Continue healthy lifestyle choices, including diet (rich in fruits, vegetables, and lean proteins, and low in salt and simple carbohydrates) and exercise (at least 30 minutes of moderate physical activity daily).  Educational handout given for shoulder pain  The above assessment and management plan was discussed with the patient. The patient verbalized understanding of and has agreed to the management plan. Patient is aware to call the clinic if symptoms persist or worsen. Patient is aware when to return to the clinic for a follow-up visit. Patient educated on when it is appropriate to go to the emergency  department.   Monia Pouch, FNP-C [facility] [phone] 03/23/19

## 2019-03-24 LAB — VITAMIN D 25 HYDROXY (VIT D DEFICIENCY, FRACTURES): Vit D, 25-Hydroxy: 30.7 ng/mL (ref 30.0–100.0)

## 2019-03-24 LAB — LIPID PANEL
Chol/HDL Ratio: 5.8 ratio — ABNORMAL HIGH (ref 0.0–4.4)
Cholesterol, Total: 226 mg/dL — ABNORMAL HIGH (ref 100–199)
HDL: 39 mg/dL — ABNORMAL LOW (ref >39)
LDL Calculated: 134 mg/dL — ABNORMAL HIGH (ref 0–99)
Triglycerides: 265 mg/dL — ABNORMAL HIGH (ref 0–149)
VLDL Cholesterol Cal: 53 mg/dL — ABNORMAL HIGH (ref 5–40)

## 2019-03-24 LAB — CMP14+EGFR
ALT: 11 IU/L (ref 0–32)
AST: 15 IU/L (ref 0–40)
Albumin/Globulin Ratio: 2.1 (ref 1.2–2.2)
Albumin: 4.5 g/dL (ref 3.8–4.9)
Alkaline Phosphatase: 86 IU/L (ref 39–117)
BUN/Creatinine Ratio: 17 (ref 9–23)
BUN: 13 mg/dL (ref 6–24)
Bilirubin Total: 0.8 mg/dL (ref 0.0–1.2)
CO2: 26 mmol/L (ref 20–29)
Calcium: 9.8 mg/dL (ref 8.7–10.2)
Chloride: 100 mmol/L (ref 96–106)
Creatinine, Ser: 0.76 mg/dL (ref 0.57–1.00)
GFR calc Af Amer: 104 mL/min/{1.73_m2} (ref >59)
GFR calc non Af Amer: 90 mL/min/{1.73_m2} (ref >59)
Globulin, Total: 2.1 g/dL (ref 1.5–4.5)
Glucose: 86 mg/dL (ref 65–99)
Potassium: 4.3 mmol/L (ref 3.5–5.2)
Sodium: 144 mmol/L (ref 134–144)
Total Protein: 6.6 g/dL (ref 6.0–8.5)

## 2019-03-24 LAB — THYROID PANEL WITH TSH
Free Thyroxine Index: 1.6 (ref 1.2–4.9)
T3 Uptake Ratio: 25 % (ref 24–39)
T4, Total: 6.2 ug/dL (ref 4.5–12.0)
TSH: 1.55 u[IU]/mL (ref 0.450–4.500)

## 2019-03-25 LAB — TOXASSURE SELECT 13 (MW), URINE

## 2019-03-27 ENCOUNTER — Encounter: Payer: Self-pay | Admitting: Family Medicine

## 2019-03-27 DIAGNOSIS — Z9114 Patient's other noncompliance with medication regimen: Secondary | ICD-10-CM | POA: Insufficient documentation

## 2019-03-27 NOTE — Addendum Note (Signed)
Addended by: Baruch Gouty on: 03/27/2019 06:09 PM   Modules accepted: Orders

## 2019-05-04 ENCOUNTER — Ambulatory Visit: Payer: Managed Care, Other (non HMO) | Admitting: Family Medicine

## 2019-12-12 ENCOUNTER — Ambulatory Visit: Payer: Self-pay | Attending: Internal Medicine

## 2019-12-12 ENCOUNTER — Other Ambulatory Visit: Payer: Self-pay

## 2019-12-12 DIAGNOSIS — Z20822 Contact with and (suspected) exposure to covid-19: Secondary | ICD-10-CM | POA: Insufficient documentation

## 2019-12-13 LAB — NOVEL CORONAVIRUS, NAA: SARS-CoV-2, NAA: NOT DETECTED

## 2019-12-13 LAB — SARS-COV-2, NAA 2 DAY TAT

## 2019-12-14 ENCOUNTER — Telehealth: Payer: Self-pay | Admitting: *Deleted

## 2019-12-14 NOTE — Telephone Encounter (Signed)
Pt given result of COVID test obtained 12/12/19; she verbalized understanding. 

## 2020-05-17 ENCOUNTER — Ambulatory Visit (INDEPENDENT_AMBULATORY_CARE_PROVIDER_SITE_OTHER): Payer: Managed Care, Other (non HMO) | Admitting: Nurse Practitioner

## 2020-05-17 ENCOUNTER — Other Ambulatory Visit: Payer: Self-pay

## 2020-05-17 ENCOUNTER — Encounter: Payer: Self-pay | Admitting: Nurse Practitioner

## 2020-05-17 VITALS — BP 132/87 | HR 78 | Temp 97.1°F | Resp 20 | Ht 64.0 in | Wt 206.0 lb

## 2020-05-17 DIAGNOSIS — F17200 Nicotine dependence, unspecified, uncomplicated: Secondary | ICD-10-CM

## 2020-05-17 DIAGNOSIS — F411 Generalized anxiety disorder: Secondary | ICD-10-CM | POA: Diagnosis not present

## 2020-05-17 DIAGNOSIS — Z0001 Encounter for general adult medical examination with abnormal findings: Secondary | ICD-10-CM

## 2020-05-17 DIAGNOSIS — Z0000 Encounter for general adult medical examination without abnormal findings: Secondary | ICD-10-CM | POA: Insufficient documentation

## 2020-05-17 NOTE — Progress Notes (Signed)
Established Patient Office Visit  Subjective:  Patient ID: Leslie Abbott, female    DOB: 16-Jan-1966  Age: 54 y.o. MRN: [id number]  CC:  Chief Complaint  Patient presents with  . Annual Exam    HPI Breslin Burklow presents for .   Encounter for general adult medical examination without abnormal findings  Physical : Patient's last physical exam was 2 year ago .  Weight: not appropriate for height (BMI greater than 27%) ;  Blood Pressure: Normal (BP greater than 120/80) ;  Medical History: Patient history reviewed ; Family history reviewed ;  Allergies Reviewed: No change in current allergies ;  Medications Reviewed: Medications reviewed - no changes ;  Lipids: Normal lipid levels ;  Smoking: Life-long-smoker, Physical Activity: Exercises at least 3 times per week ; no Alcohol/Drug Use: Is a social-drinker ; illicit drug use ; Marijuana Patient is not afflicted from Stress Incontinence and Urge Incontinence  Safety: reviewed ; Patient wears a seat belt, has smoke detectors, has carbon monoxide detectors, and wears sunscreen with extended sun exposure. Dental Care: biannual cleanings, brushes and flosses daily. Ophthalmology/Optometry: Annual visit.  Hearing loss: none Vision impairments: yes (wears prescription glasses)  Past Medical History:  Diagnosis Date  . Anxiety   . Depression      Office Visit from 10/29/2018 in [facility]  PHQ-9 Total Score 14     Past Surgical History:  Procedure Laterality Date  . birth mark     birth mark removal 40 years ago  . TUBAL LIGATION      Family History  Problem Relation Age of Onset  . Cancer Mother        lung  . Heart disease Father        heart attack      Outpatient Medications Prior to Visit  Medication Sig Dispense Refill  . Ascorbic Acid (VITAMIN C) 100 MG tablet Take 100 mg by mouth daily.    . cholecalciferol (VITAMIN D3) 25 MCG (1000 UT) tablet Take 2,000 Units by mouth daily.    . Omega 3 1000  MG CAPS Take by mouth.    . zinc gluconate 50 MG tablet Take 50 mg by mouth daily.    . Biotin 5000 MCG TABS Take 1 tablet by mouth daily.    . busPIRone (BUSPAR) 10 MG tablet Take 1 tablet (10 mg total) by mouth 2 (two) times daily. 180 tablet 3  . diclofenac sodium (VOLTAREN) 1 % GEL Apply 2 g topically 4 (four) times daily. 150 g 0  . escitalopram (LEXAPRO) 20 MG tablet Take 1 tablet (20 mg total) by mouth daily. 90 tablet 0  . Misc Natural Products (OSTEO BI-FLEX TRIPLE STRENGTH PO) Take 2 tablets by mouth daily.    . Red Yeast Rice Extract (RED YEAST RICE PO) Take by mouth.     No facility-administered medications prior to visit.     ROS Review of Systems  All other systems reviewed and are negative.     Objective:    Physical Exam Vitals reviewed.  Constitutional:      Appearance: Normal appearance. She is obese.  HENT:     Head: Normocephalic.     Right Ear: External ear normal. There is no impacted cerumen.     Left Ear: External ear normal. There is no impacted cerumen.     Nose: Nose normal.     Mouth/Throat:     Mouth: Mucous membranes are moist.     Pharynx:  Oropharynx is clear.  Eyes:     Conjunctiva/sclera: Conjunctivae normal.  Cardiovascular:     Rate and Rhythm: Normal rate and regular rhythm.     Pulses: Normal pulses.     Heart sounds: Normal heart sounds.  Pulmonary:     Effort: Pulmonary effort is normal.     Breath sounds: Normal breath sounds.  Abdominal:     General: Bowel sounds are normal. There is no distension.     Palpations: There is no mass.     Tenderness: There is no abdominal tenderness.  Musculoskeletal:        General: Normal range of motion.     Cervical back: Normal range of motion.  Skin:    General: Skin is warm.  Neurological:     Mental Status: She is alert and oriented to person, place, and time.  Psychiatric:        Mood and Affect: Mood normal.        Behavior: Behavior normal.        Thought Content: Thought content  normal.        Judgment: Judgment normal.     BP 132/87   Pulse 78   Temp (!) 97.1 F (36.2 C) (Temporal)   Resp 20   Ht 5\' 4"  (1.626 m)   Wt 206 lb (93.4 kg)   SpO2 97%   BMI 35.36 kg/m  Wt Readings from Last 3 Encounters:  05/17/20 206 lb (93.4 kg)  03/23/19 204 lb (92.5 kg)  10/29/18 211 lb (95.7 kg)     Health Maintenance Due  Topic Date Due  . Hepatitis C Screening  Never done  . HIV Screening  Never done  . MAMMOGRAM  08/09/2017  . PAP SMEAR-Modifier  08/09/2018    There are no preventive care reminders to display for this patient.  Lab Results  Component Value Date   TSH 1.550 03/23/2019   Lab Results  Component Value Date   WBC 8.1 09/17/2018   HGB 15.4 09/17/2018   HCT 43.6 09/17/2018   MCV 90 09/17/2018   PLT 225 09/17/2018   Lab Results  Component Value Date   NA 144 03/23/2019   K 4.3 03/23/2019   CO2 26 03/23/2019   GLUCOSE 86 03/23/2019   BUN 13 03/23/2019   CREATININE 0.76 03/23/2019   BILITOT 0.8 03/23/2019   ALKPHOS 86 03/23/2019   AST 15 03/23/2019   ALT 11 03/23/2019   PROT 6.6 03/23/2019   ALBUMIN 4.5 03/23/2019   CALCIUM 9.8 03/23/2019   Lab Results  Component Value Date   CHOL 226 (H) 03/23/2019   Lab Results  Component Value Date   HDL 39 (L) 03/23/2019   Lab Results  Component Value Date   LDLCALC 134 (H) 03/23/2019   Lab Results  Component Value Date   TRIG 265 (H) 03/23/2019   Lab Results  Component Value Date   CHOLHDL 5.8 (H) 03/23/2019   Lab Results  Component Value Date   HGBA1C 5.7 05/25/2017      Assessment & Plan:   Problem List Items Addressed This Visit      Other   GAD (generalized anxiety disorder)    Well managed. Patient  reports that she is no longer taking any antianxiety medication and has not had any new episodes of anxieties.      Tobacco use disorder    Patient is not ready to quit, provided smoking cessation education to patient, with printed hand out provided. Patient  verbalize " I will make efforts to start quitting"      Annual physical exam - Primary    Patient is a 54 year old female who presents to clinic to reestablish care and annual physical exam.  Patient has no new concerns.  Completed head to toe assessment, provided health maintenance and preventative health education with printed handouts given.  Patient refused Covid-19 vaccine, flu vaccine.  Patient is not having a Pap smear and mammogram done today.  She is scheduled for an OB/GYN appointment in [place] and will have that completed.  Patient refused colonoscopy/Cologuard.  Lifelong smoker with 20-year pack history patient is not ready to quit smoking yet but has intention to do it in the future.   Labs completed today results pending: CBC, CMP, lipid.  Follow-up in 1 year      Relevant Orders   Lipid Panel   CBC with Differential   Comprehensive metabolic panel   SARS-CoV-2 Semi-Quantitative Total Antibody, Spike      No orders of the defined types were placed in this encounter.   Follow-up: Return in about 1 year (around 05/17/2021).    Daryll Drown, NP

## 2020-05-17 NOTE — Assessment & Plan Note (Signed)
Patient is a 54 year old female who presents to clinic to reestablish care and annual physical exam.  Patient has no new concerns.  Completed head to toe assessment, provided health maintenance and preventative health education with printed handouts given.  Patient refused Covid-19 vaccine, flu vaccine.  Patient is not having a Pap smear and mammogram done today.  She is scheduled for an OB/GYN appointment in [place] and will have that completed.  Patient refused colonoscopy/Cologuard.  Lifelong smoker with 20-year pack history patient is not ready to quit smoking yet but has intention to do it in the future.   Labs completed today results pending: CBC, CMP, lipid.  Follow-up in 1 year

## 2020-05-17 NOTE — Patient Instructions (Addendum)
Steps to Quit Smoking Smoking tobacco is the leading cause of preventable death. It can affect almost every organ in the body. Smoking puts you and those around you at risk for developing many serious chronic diseases. Quitting smoking can be difficult, but it is one of the best things that you can do for your health. It is never too late to quit. How do I get ready to quit? When you decide to quit smoking, create a plan to help you succeed. Before you quit:  Pick a date to quit. Set a date within the next 2 weeks to give you time to prepare.  Write down the reasons why you are quitting. Keep this list in places where you will see it often.  Tell your family, friends, and co-workers that you are quitting. Support from your loved ones can make quitting easier.  Talk with your health care provider about your options for quitting smoking.  Find out what treatment options are covered by your health insurance.  Identify people, places, things, and activities that make you want to smoke (triggers). Avoid them. What first steps can I take to quit smoking?  Throw away all cigarettes at home, at work, and in your car.  Throw away smoking accessories, such as ashtrays and lighters.  Clean your car. Make sure to empty the ashtray.  Clean your home, including curtains and carpets. What strategies can I use to quit smoking? Talk with your health care provider about combining strategies, such as taking medicines while you are also receiving in-person counseling. Using these two strategies together makes you more likely to succeed in quitting than if you used either strategy on its own.  If you are pregnant or breastfeeding, talk with your health care provider about finding counseling or other support strategies to quit smoking. Do not take medicine to help you quit smoking unless your health care provider tells you to do so. To quit smoking: Quit right away  Quit smoking completely, instead of  gradually reducing how much you smoke over a period of time. Research shows that stopping smoking right away is more successful than gradually quitting.  Attend in-person counseling to help you build problem-solving skills. You are more likely to succeed in quitting if you attend counseling sessions regularly. Even short sessions of 10 minutes can be effective. Take medicine You may take medicines to help you quit smoking. Some medicines require a prescription and some you can purchase over-the-counter. Medicines may have nicotine in them to replace the nicotine in cigarettes. Medicines may:  Help to stop cravings.  Help to relieve withdrawal symptoms. Your health care provider may recommend:  Nicotine patches, gum, or lozenges.  Nicotine inhalers or sprays.  Non-nicotine medicine that is taken by mouth. Find resources Find resources and support systems that can help you to quit smoking and remain smoke-free after you quit. These resources are most helpful when you use them often. They include:  Online chats with a counselor.  Telephone quitlines.  Printed self-help materials.  Support groups or group counseling.  Text messaging programs.  Mobile phone apps or applications. Use apps that can help you stick to your quit plan by providing reminders, tips, and encouragement. There are many free apps for mobile devices as well as websites. Examples include Quit Guide from the CDC and smokefree.gov What things can I do to make it easier to quit?   Reach out to your family and friends for support and encouragement. Call telephone quitlines (1-800-QUIT-NOW), reach   out to support groups, or work with a Veterinary surgeon for support.  Ask people who smoke to avoid smoking around you.  Avoid places that trigger you to smoke, such as bars, parties, or smoke-break areas at work.  Spend time with people who do not smoke.  Lessen the stress in your life. Stress can be a smoking trigger for some  people. To lessen stress, try: ? Exercising regularly. ? Doing deep-breathing exercises. ? Doing yoga. ? Meditating. ? Performing a body scan. This involves closing your eyes, scanning your body from head to toe, and noticing which parts of your body are particularly tense. Try to relax the muscles in those areas. How will I feel when I quit smoking? Day 1 to 3 weeks Within the first 24 hours of quitting smoking, you may start to feel withdrawal symptoms. These symptoms are usually most noticeable 2-3 days after quitting, but they usually do not last for more than 2-3 weeks. You may experience these symptoms:  Mood swings.  Restlessness, anxiety, or irritability.  Trouble concentrating.  Dizziness.  Strong cravings for sugary foods and nicotine.  Mild weight gain.  Constipation.  Nausea.  Coughing or a sore throat.  Changes in how the medicines that you take for unrelated issues work in your body.  Depression.  Trouble sleeping (insomnia). Week 3 and afterward After the first 2-3 weeks of quitting, you may start to notice more positive results, such as:  Improved sense of smell and taste.  Decreased coughing and sore throat.  Slower heart rate.  Lower blood pressure.  Clearer skin.  The ability to breathe more easily.  Fewer sick days. Quitting smoking can be very challenging. Do not get discouraged if you are not successful the first time. Some people need to make many attempts to quit before they achieve long-term success. Do your best to stick to your quit plan, and talk with your health care provider if you have any questions or concerns. Summary  Smoking tobacco is the leading cause of preventable death. Quitting smoking is one of the best things that you can do for your health.  When you decide to quit smoking, create a plan to help you succeed.  Quit smoking right away, not slowly over a period of time.  When you start quitting, seek help from your  health care provider, family, or friends. This information is not intended to replace advice given to you by your health care provider. Make sure you discuss any questions you have with your health care provider. Document Revised: 04/29/2019 Document Reviewed: 10/23/2018 Elsevier Patient Education  2020 [employer]. Health Maintenance, Female Adopting a healthy lifestyle and getting preventive care are important in promoting health and wellness. Ask your health care provider about:  The right schedule for you to have regular tests and exams.  Things you can do on your own to prevent diseases and keep yourself healthy. What should I know about diet, weight, and exercise? Eat a healthy diet   Eat a diet that includes plenty of vegetables, fruits, low-fat dairy products, and lean protein.  Do not eat a lot of foods that are high in solid fats, added sugars, or sodium. Maintain a healthy weight Body mass index (BMI) is used to identify weight problems. It estimates body fat based on height and weight. Your health care provider can help determine your BMI and help you achieve or maintain a healthy weight. Get regular exercise Get regular exercise. This is one of the most important  things you can do for your health. Most adults should:  Exercise for at least 150 minutes each week. The exercise should increase your heart rate and make you sweat (moderate-intensity exercise).  Do strengthening exercises at least twice a week. This is in addition to the moderate-intensity exercise.  Spend less time sitting. Even light physical activity can be beneficial. Watch cholesterol and blood lipids Have your blood tested for lipids and cholesterol at 54 years of age, then have this test every 5 years. Have your cholesterol levels checked more often if:  Your lipid or cholesterol levels are high.  You are older than 54 years of age.  You are at high risk for heart disease. What should I know about  cancer screening? Depending on your health history and family history, you may need to have cancer screening at various ages. This may include screening for:  Breast cancer.  Cervical cancer.  Colorectal cancer.  Skin cancer.  Lung cancer. What should I know about heart disease, diabetes, and high blood pressure? Blood pressure and heart disease  High blood pressure causes heart disease and increases the risk of stroke. This is more likely to develop in people who have high blood pressure readings, are of African descent, or are overweight.  Have your blood pressure checked: ? Every 3-5 years if you are 40-52 years of age. ? Every year if you are 51 years old or older. Diabetes Have regular diabetes screenings. This checks your fasting blood sugar level. Have the screening done:  Once every three years after age 64 if you are at a normal weight and have a low risk for diabetes.  More often and at a younger age if you are overweight or have a high risk for diabetes. What should I know about preventing infection? Hepatitis B If you have a higher risk for hepatitis B, you should be screened for this virus. Talk with your health care provider to find out if you are at risk for hepatitis B infection. Hepatitis C Testing is recommended for:  Everyone born from [birth year] through 1965.  Anyone with known risk factors for hepatitis C. Sexually transmitted infections (STIs)  Get screened for STIs, including gonorrhea and chlamydia, if: ? You are sexually active and are younger than 54 years of age. ? You are older than 54 years of age and your health care provider tells you that you are at risk for this type of infection. ? Your sexual activity has changed since you were last screened, and you are at increased risk for chlamydia or gonorrhea. Ask your health care provider if you are at risk.  Ask your health care provider about whether you are at high risk for HIV. Your health care  provider may recommend a prescription medicine to help prevent HIV infection. If you choose to take medicine to prevent HIV, you should first get tested for HIV. You should then be tested every 3 months for as long as you are taking the medicine. Pregnancy  If you are about to stop having your period (premenopausal) and you may become pregnant, seek counseling before you get pregnant.  Take 400 to 800 micrograms (mcg) of folic acid every day if you become pregnant.  Ask for birth control (contraception) if you want to prevent pregnancy. Osteoporosis and menopause Osteoporosis is a disease in which the bones lose minerals and strength with aging. This can result in bone fractures. If you are 22 years old or older, or if you are  at risk for osteoporosis and fractures, ask your health care provider if you should:  Be screened for bone loss.  Take a calcium or vitamin D supplement to lower your risk of fractures.  Be given hormone replacement therapy (HRT) to treat symptoms of menopause. Follow these instructions at home: Lifestyle  Do not use any products that contain nicotine or tobacco, such as cigarettes, e-cigarettes, and chewing tobacco. If you need help quitting, ask your health care provider.  Do not use street drugs.  Do not share needles.  Ask your health care provider for help if you need support or information about quitting drugs. Alcohol use  Do not drink alcohol if: ? Your health care provider tells you not to drink. ? You are pregnant, may be pregnant, or are planning to become pregnant.  If you drink alcohol: ? Limit how much you use to 0-1 drink a day. ? Limit intake if you are breastfeeding.  Be aware of how much alcohol is in your drink. In the [place] one drink equals one 12 oz bottle of beer (355 mL), one 5 oz glass of wine (148 mL), or one 1 oz glass of hard liquor (44 mL). General instructions  Schedule regular health, dental, and eye exams.  Stay current  with your vaccines.  Tell your health care provider if: ? You often feel depressed. ? You have ever been abused or do not feel safe at home. Summary  Adopting a healthy lifestyle and getting preventive care are important in promoting health and wellness.  Follow your health care provider's instructions about healthy diet, exercising, and getting tested or screened for diseases.  Follow your health care provider's instructions on monitoring your cholesterol and blood pressure. This information is not intended to replace advice given to you by your health care provider. Make sure you discuss any questions you have with your health care provider. Document Revised: 07/28/2018 Document Reviewed: 07/28/2018 Elsevier Patient Education  2020 [employer].

## 2020-05-17 NOTE — Assessment & Plan Note (Signed)
Well managed. Patient  reports that she is no longer taking any antianxiety medication and has not had any new episodes of anxieties.

## 2020-05-17 NOTE — Assessment & Plan Note (Signed)
Patient is not ready to quit, provided smoking cessation education to patient, with printed hand out provided. Patient verbalize " I will make efforts to start quitting"

## 2020-05-18 LAB — CBC WITH DIFFERENTIAL/PLATELET
Basophils Absolute: 0.1 10*3/uL (ref 0.0–0.2)
Basos: 1 %
EOS (ABSOLUTE): 0.4 10*3/uL (ref 0.0–0.4)
Eos: 5 %
Hematocrit: 45.4 % (ref 34.0–46.6)
Hemoglobin: 15.5 g/dL (ref 11.1–15.9)
Immature Grans (Abs): 0 10*3/uL (ref 0.0–0.1)
Immature Granulocytes: 0 %
Lymphocytes Absolute: 2.4 10*3/uL (ref 0.7–3.1)
Lymphs: 28 %
MCH: 31.8 pg (ref 26.6–33.0)
MCHC: 34.1 g/dL (ref 31.5–35.7)
MCV: 93 fL (ref 79–97)
Monocytes Absolute: 0.6 10*3/uL (ref 0.1–0.9)
Monocytes: 6 %
Neutrophils Absolute: 5.1 10*3/uL (ref 1.4–7.0)
Neutrophils: 60 %
Platelets: 224 10*3/uL (ref 150–450)
RBC: 4.88 x10E6/uL (ref 3.77–5.28)
RDW: 12.5 % (ref 11.7–15.4)
WBC: 8.6 10*3/uL (ref 3.4–10.8)

## 2020-05-18 LAB — COMPREHENSIVE METABOLIC PANEL
ALT: 14 IU/L (ref 0–32)
AST: 15 IU/L (ref 0–40)
Albumin/Globulin Ratio: 2.3 — ABNORMAL HIGH (ref 1.2–2.2)
Albumin: 4.6 g/dL (ref 3.8–4.9)
Alkaline Phosphatase: 102 IU/L (ref 44–121)
BUN/Creatinine Ratio: 20 (ref 9–23)
BUN: 16 mg/dL (ref 6–24)
Bilirubin Total: 0.7 mg/dL (ref 0.0–1.2)
CO2: 26 mmol/L (ref 20–29)
Calcium: 9.7 mg/dL (ref 8.7–10.2)
Chloride: 101 mmol/L (ref 96–106)
Creatinine, Ser: 0.8 mg/dL (ref 0.57–1.00)
GFR calc Af Amer: 97 mL/min/{1.73_m2} (ref >59)
GFR calc non Af Amer: 84 mL/min/{1.73_m2} (ref >59)
Globulin, Total: 2 g/dL (ref 1.5–4.5)
Glucose: 93 mg/dL (ref 65–99)
Potassium: 4.9 mmol/L (ref 3.5–5.2)
Sodium: 141 mmol/L (ref 134–144)
Total Protein: 6.6 g/dL (ref 6.0–8.5)

## 2020-05-18 LAB — LIPID PANEL
Chol/HDL Ratio: 5.8 ratio — ABNORMAL HIGH (ref 0.0–4.4)
Cholesterol, Total: 236 mg/dL — ABNORMAL HIGH (ref 100–199)
HDL: 41 mg/dL (ref >39)
LDL Chol Calc (NIH): 139 mg/dL — ABNORMAL HIGH (ref 0–99)
Triglycerides: 311 mg/dL — ABNORMAL HIGH (ref 0–149)
VLDL Cholesterol Cal: 56 mg/dL — ABNORMAL HIGH (ref 5–40)

## 2020-05-18 LAB — SARS-COV-2 SEMI-QUANTITATIVE TOTAL ANTIBODY, SPIKE
SARS-CoV-2 Semi-Quant Total Ab: <0.4 U/mL (ref <0.8)
SARS-CoV-2 Spike Ab Interp: NEGATIVE

## 2020-05-30 ENCOUNTER — Telehealth: Payer: Self-pay

## 2020-05-30 NOTE — Telephone Encounter (Signed)
Patient called to get lab results.  Gave her the results and patient verbalized understanding.  Would like to hold off on starting any medication right now.  She wants to start the diet,exercise first.  Can we send her what type of cholesterol diet she should follow please?

## 2020-05-30 NOTE — Telephone Encounter (Signed)
Patient aware and verbalized understanding. °

## 2020-08-27 ENCOUNTER — Ambulatory Visit (INDEPENDENT_AMBULATORY_CARE_PROVIDER_SITE_OTHER): Payer: Managed Care, Other (non HMO) | Admitting: Nurse Practitioner

## 2020-08-27 ENCOUNTER — Other Ambulatory Visit: Payer: Self-pay

## 2020-08-27 ENCOUNTER — Encounter: Payer: Self-pay | Admitting: Nurse Practitioner

## 2020-08-27 VITALS — BP 147/95 | HR 85 | Temp 97.0°F | Ht 64.0 in | Wt 204.6 lb

## 2020-08-27 DIAGNOSIS — F430 Acute stress reaction: Secondary | ICD-10-CM

## 2020-08-27 DIAGNOSIS — F321 Major depressive disorder, single episode, moderate: Secondary | ICD-10-CM | POA: Diagnosis not present

## 2020-08-27 DIAGNOSIS — F411 Generalized anxiety disorder: Secondary | ICD-10-CM | POA: Diagnosis not present

## 2020-08-27 MED ORDER — ESCITALOPRAM OXALATE 10 MG PO TABS: 10.0000 mg | ORAL_TABLET | Freq: Every day | ORAL | 0 refills | Status: DC

## 2020-08-27 MED ORDER — HYDROXYZINE HCL 10 MG PO TABS: 10.0000 mg | ORAL_TABLET | Freq: Three times a day (TID) | ORAL | 0 refills | Status: DC | PRN

## 2020-08-27 NOTE — Patient Instructions (Addendum)
Textbook of family medicine (9th ed., pp. 1062-1073). [place], [place]: [employer].">  Stress, Adult Stress is a normal reaction to life events. Stress is what you feel when life demands more than you are used to, or more than you think you can handle. Some stress can be useful, such as studying for a test or meeting a deadline at work. Stress that occurs too often or for too long can cause problems. It can affect your emotional health and interfere with relationships and normal daily activities. Too much stress can weaken your body's defense system (immune system) and increase your risk for physical illness. If you already have a medical problem, stress can make it worse. What are the causes? All sorts of life events can cause stress. An event that causes stress for one person may not be stressful for another person. Major life events, whether positive or negative, commonly cause stress. Examples include:  Losing a job or starting a new job.  Losing a loved one.  Moving to a new town or home.  Getting married or divorced.  Having a baby.  Getting injured or sick. Less obvious life events can also cause stress, especially if they occur day after day or in combination with each other. Examples include:  Working long hours.  Driving in traffic.  Caring for children.  Being in debt.  Being in a difficult relationship. What are the signs or symptoms? Stress can cause emotional symptoms, including:  Anxiety. This is feeling worried, afraid, on edge, overwhelmed, or out of control.  Anger, including irritation or impatience.  Depression. This is feeling sad, down, helpless, or guilty.  Trouble focusing, remembering, or making decisions. Stress can cause physical symptoms, including:  Aches and pains. These may affect your head, neck, back, stomach, or other areas of your body.  Tight muscles or a clenched jaw.  Low energy.  Trouble sleeping. Stress can cause unhealthy  behaviors, including:  Eating to feel better (overeating) or skipping meals.  Working too much or putting off tasks.  Smoking, drinking alcohol, or using drugs to feel better. How is this diagnosed? Stress is diagnosed through an assessment by your health care provider. He or she may diagnose this condition based on:  Your symptoms and any stressful life events.  Your medical history.  Tests to rule out other causes of your symptoms. Depending on your condition, your health care provider may refer you to a specialist for further evaluation. How is this treated? Stress management techniques are the recommended treatment for stress. Medicine is not typically recommended for the treatment of stress. Techniques to reduce your reaction to stressful life events include:  Stress identification. Monitor yourself for symptoms of stress and identify what causes stress for you. These skills may help you to avoid or prepare for stressful events.  Time management. Set your priorities, keep a calendar of events, and learn to say no. Taking these actions can help you avoid making too many commitments. Techniques for coping with stress include:  Rethinking the problem. Try to think realistically about stressful events rather than ignoring them or overreacting. Try to find the positives in a stressful situation rather than focusing on the negatives.  Exercise. Physical exercise can release both physical and emotional tension. The key is to find a form of exercise that you enjoy and do it regularly.  Relaxation techniques. These relax the body and mind. The key is to find one or more that you enjoy and use the techniques regularly. Examples include: ?   Meditation, deep breathing, or progressive relaxation techniques. ? Yoga or tai chi. ? Biofeedback, mindfulness techniques, or journaling. ? Listening to music, being out in nature, or participating in other hobbies.  Practicing a healthy lifestyle.  Eat a balanced diet, drink plenty of water, limit or avoid caffeine, and get plenty of sleep.  Having a strong support network. Spend time with family, friends, or other people you enjoy being around. Express your feelings and talk things over with someone you trust. Counseling or talk therapy with a mental health professional may be helpful if you are having trouble managing stress on your own.   Follow these instructions at home: Lifestyle  Avoid drugs.  Do not use any products that contain nicotine or tobacco, such as cigarettes, e-cigarettes, and chewing tobacco. If you need help quitting, ask your health care provider.  Limit alcohol intake to no more than 1 drink a day for nonpregnant women and 2 drinks a day for men. One drink equals 12 oz of beer, 5 oz of wine, or 1 oz of hard liquor  Do not use alcohol or drugs to relax.  Eat a balanced diet that includes fresh fruits and vegetables, whole grains, lean meats, fish, eggs, and beans, and low-fat dairy. Avoid processed foods and foods high in added fat, sugar, and salt.  Exercise at least 30 minutes on 5 or more days each week.  Get 7-8 hours of sleep each night.   General instructions  Practice stress management techniques as discussed with your health care provider.  Drink enough fluid to keep your urine clear or pale yellow.  Take over-the-counter and prescription medicines only as told by your health care provider.  Keep all follow-up visits as told by your health care provider. This is important.   Contact a health care provider if:  Your symptoms get worse.  You have new symptoms.  You feel overwhelmed by your problems and can no longer manage them on your own. Get help right away if:  You have thoughts of hurting yourself or others. If you ever feel like you may hurt yourself or others, or have thoughts about taking your own life, get help right away. You can go to your nearest emergency department or  call:  Your local emergency services (911 in the [place]  A suicide crisis helpline, such as the [place] at (913)491-6586. This is open 24 hours a day. Summary  Stress is a normal reaction to life events. It can cause problems if it happens too often or for too long.  Practicing stress management techniques is the best way to treat stress.  Counseling or talk therapy with a mental health professional may be helpful if you are having trouble managing stress on your own. This information is not intended to replace advice given to you by your health care provider. Make sure you discuss any questions you have with your health care provider. Document Revised: 04/20/2020 Document Reviewed: 04/20/2020 Elsevier Patient Education  2021 [place].  http://APA.org/depression-guideline"> https://clinicalkey.com"> http://point-of-care.elsevierperformancemanager.com/skills/"> http://point-of-care.elsevierperformancemanager.com">  Managing Depression, Adult Depression is a mental health condition that affects your thoughts, feelings, and actions. Being diagnosed with depression can bring you relief if you did not know why you have felt or behaved a certain way. It could also leave you feeling overwhelmed with uncertainty about your future. Preparing yourself to manage your symptoms can help you feel more positive about your future. How to manage lifestyle changes Managing stress Stress is your body's reaction to  life changes and events, both good and bad. Stress can add to your feelings of depression. Learning to manage your stress can help lessen your feelings of depression. Try some of the following approaches to reducing your stress (stress reduction techniques):  Listen to music that you enjoy and that inspires you.  Try using a meditation app or take a meditation class.  Develop a practice that helps you connect with your spiritual self. Walk in nature, pray, or go  to a place of worship.  Do some deep breathing. To do this, inhale slowly through your nose. Pause at the top of your inhale for a few seconds and then exhale slowly, letting your muscles relax.  Practice yoga to help relax and work your muscles. Choose a stress reduction technique that suits your lifestyle and personality. These techniques take time and practice to develop. Set aside 5-15 minutes a day to do them. Therapists can offer training in these techniques. Other things you can do to manage stress include:  Keeping a stress diary.  Knowing your limits and saying no when you think something is too much.  Paying attention to how you react to certain situations. You may not be able to control everything, but you can change your reaction.  Adding humor to your life by watching funny films or TV shows.  Making time for activities that you enjoy and that relax you.   Medicines Medicines, such as antidepressants, are often a part of treatment for depression.  Talk with your pharmacist or health care provider about all the medicines, supplements, and herbal products that you take, their possible side effects, and what medicines and other products are safe to take together.  Make sure to report any side effects you may have to your health care provider. Relationships Your health care provider may suggest family therapy, couples therapy, or individual therapy as part of your treatment. How to recognize changes Everyone responds differently to treatment for depression. As you recover from depression, you may start to:  Have more interest in doing activities.  Feel less hopeless.  Have more energy.  Overeat less often, or have a better appetite.  Have better mental focus. It is important to recognize if your depression is not getting better or is getting worse. The symptoms you had in the beginning may return, such as:  Tiredness (fatigue) or low energy.  Eating too much or too  little.  Sleeping too much or too little.  Feeling restless, agitated, or hopeless.  Trouble focusing or making decisions.  Unexplained physical complaints.  Feeling irritable, angry, or aggressive. If you or your family members notice these symptoms coming back, let your health care provider know right away. Follow these instructions at home: Activity  Try to get some form of exercise each day, such as walking, biking, swimming, or lifting weights.  Practice stress reduction techniques.  Engage your mind by taking a class or doing some volunteer work.   Lifestyle  Get the right amount and quality of sleep.  Cut down on using caffeine, tobacco, alcohol, and other potentially harmful substances.  Eat a healthy diet that includes plenty of vegetables, fruits, whole grains, low-fat dairy products, and lean protein. Do not eat a lot of foods that are high in solid fats, added sugars, or salt (sodium). General instructions  Take over-the-counter and prescription medicines only as told by your health care provider.  Keep all follow-up visits as told by your health care provider. This is important. Where  to find support Talking to others Friends and family members can be sources of support and guidance. Talk to trusted friends or family members about your condition. Explain your symptoms to them, and let them know that you are working with a health care provider to treat your depression. Tell friends and family members how they also can be helpful.   Finances  Find appropriate mental health providers that fit with your financial situation.  Talk with your health care provider about options to get reduced prices on your medicines. Where to find more information You can find support in your area from:  Anxiety and Depression Association of America (ADAA): www.adaa.org  Mental Health America: www.mentalhealthamerica.net  [employer] on Mental Illness: www.nami.org Contact  a health care provider if:  You stop taking your antidepressant medicines, and you have any of these symptoms: ? Nausea. ? Headache. ? Light-headedness. ? Chills and body aches. ? Not being able to sleep (insomnia).  You or your friends and family think your depression is getting worse. Get help right away if:  You have thoughts of hurting yourself or others. If you ever feel like you may hurt yourself or others, or have thoughts about taking your own life, get help right away. Go to your nearest emergency department or:  Call your local emergency services (911 in the [place]  Call a suicide crisis helpline, such as the [place] at 973-816-2663. This is open 24 hours a day in the [place]  Text the Crisis Text Line at 804 793 1765 (in the [place].). Summary  If you are diagnosed with depression, preparing yourself to manage your symptoms is a good way to feel positive about your future.  Work with your health care provider on a management plan that includes stress reduction techniques, medicines (if applicable), therapy, and healthy lifestyle habits.  Keep talking with your health care provider about how your treatment is working.  If you have thoughts about taking your own life, call a suicide crisis helpline or text a crisis text line. This information is not intended to replace advice given to you by your health care provider. Make sure you discuss any questions you have with your health care provider. Document Revised: 06/15/2019 Document Reviewed: 06/15/2019 Elsevier Patient Education  2021 [place].  http://NIMH.NIH.Gov">  Generalized Anxiety Disorder, Adult Generalized anxiety disorder (GAD) is a mental health condition. Unlike normal worries, anxiety related to GAD is not triggered by a specific event. These worries do not fade or get better with time. GAD interferes with relationships, work, and school. GAD symptoms can vary from mild to severe. People  with severe GAD can have intense waves of anxiety with physical symptoms that are similar to panic attacks. What are the causes? The exact cause of GAD is not known, but the following are believed to have an impact:  Differences in natural brain chemicals.  Genes passed down from parents to children.  Differences in the way threats are perceived.  Development during childhood.  Personality. What increases the risk? The following factors may make you more likely to develop this condition:  Being female.  Having a family history of anxiety disorders.  Being very shy.  Experiencing very stressful life events, such as the death of a loved one.  Having a very stressful family environment. What are the signs or symptoms? People with GAD often worry excessively about many things in their lives, such as their health and family. Symptoms may also include:  Mental and emotional  symptoms: ? Worrying excessively about natural disasters. ? Fear of being late. ? Difficulty concentrating. ? Fears that others are judging your performance.  Physical symptoms: ? Fatigue. ? Headaches, muscle tension, muscle twitches, trembling, or feeling shaky. ? Feeling like your heart is pounding or beating very fast. ? Feeling out of breath or like you cannot take a deep breath. ? Having trouble falling asleep or staying asleep, or experiencing restlessness. ? Sweating. ? Nausea, diarrhea, or irritable bowel syndrome (IBS).  Behavioral symptoms: ? Experiencing erratic moods or irritability. ? Avoidance of new situations. ? Avoidance of people. ? Extreme difficulty making decisions. How is this diagnosed? This condition is diagnosed based on your symptoms and medical history. You will also have a physical exam. Your health care provider may perform tests to rule out other possible causes of your symptoms. To be diagnosed with GAD, a person must have anxiety that:  Is out of his or her  control.  Affects several different aspects of his or her life, such as work and relationships.  Causes distress that makes him or her unable to take part in normal activities.  Includes at least three symptoms of GAD, such as restlessness, fatigue, trouble concentrating, irritability, muscle tension, or sleep problems. Before your health care provider can confirm a diagnosis of GAD, these symptoms must be present more days than they are not, and they must last for 6 months or longer. How is this treated? This condition may be treated with:  Medicine. Antidepressant medicine is usually prescribed for long-term daily control. Anti-anxiety medicines may be added in severe cases, especially when panic attacks occur.  Talk therapy (psychotherapy). Certain types of talk therapy can be helpful in treating GAD by providing support, education, and guidance. Options include: ? Cognitive behavioral therapy (CBT). People learn coping skills and self-calming techniques to ease their physical symptoms. They learn to identify unrealistic thoughts and behaviors and to replace them with more appropriate thoughts and behaviors. ? Acceptance and commitment therapy (ACT). This treatment teaches people how to be mindful as a way to cope with unwanted thoughts and feelings. ? Biofeedback. This process trains you to manage your body's response (physiological response) through breathing techniques and relaxation methods. You will work with a therapist while machines are used to monitor your physical symptoms.  Stress management techniques. These include yoga, meditation, and exercise. A mental health specialist can help determine which treatment is best for you. Some people see improvement with one type of therapy. However, other people require a combination of therapies.   Follow these instructions at home: Lifestyle  Maintain a consistent routine and schedule.  Anticipate stressful situations. Create a plan, and  allow extra time to work with your plan.  Practice stress management or self-calming techniques that you have learned from your therapist or your health care provider. General instructions  Take over-the-counter and prescription medicines only as told by your health care provider.  Understand that you are likely to have setbacks. Accept this and be kind to yourself as you persist to take better care of yourself.  Recognize and accept your accomplishments, even if you judge them as small.  Keep all follow-up visits as told by your health care provider. This is important. Contact a health care provider if:  Your symptoms do not get better.  Your symptoms get worse.  You have signs of depression, such as: ? A persistently sad or irritable mood. ? Loss of enjoyment in activities that used to bring you joy. ?  Change in weight or eating. ? Changes in sleeping habits. ? Avoiding friends or family members. ? Loss of energy for normal tasks. ? Feelings of guilt or worthlessness. Get help right away if:  You have serious thoughts about hurting yourself or others. If you ever feel like you may hurt yourself or others, or have thoughts about taking your own life, get help right away. Go to your nearest emergency department or:  Call your local emergency services (911 in the [place]  Call a suicide crisis helpline, such as the [place] at 704-222-7541. This is open 24 hours a day in the [place]  Text the Crisis Text Line at 479 096 0185 (in the [place].). Summary  Generalized anxiety disorder (GAD) is a mental health condition that involves worry that is not triggered by a specific event.  People with GAD often worry excessively about many things in their lives, such as their health and family.  GAD may cause symptoms such as restlessness, trouble concentrating, sleep problems, frequent sweating, nausea, diarrhea, headaches, and trembling or muscle twitching.  A mental  health specialist can help determine which treatment is best for you. Some people see improvement with one type of therapy. However, other people require a combination of therapies. This information is not intended to replace advice given to you by your health care provider. Make sure you discuss any questions you have with your health care provider. Document Revised: 05/25/2019 Document Reviewed: 05/25/2019 Elsevier Patient Education  [place].

## 2020-08-27 NOTE — Assessment & Plan Note (Signed)
Symptoms not well controlled.  This is new for patient in the last few weeks due to situational changes in her family.  Provided education to patient with printed handouts given.  Hydroxyzine as needed ordered. Rx sent to pharmacy.  Follow-up with worsening unresolved symptoms.

## 2020-08-27 NOTE — Assessment & Plan Note (Signed)
Depression not well controlled.  Completed PHQ-9.  Start the patient on Lexapro 10 mg tablet by mouth once daily.  Follow-up in 4 to 6 weeks.  Provided education with printed handouts given.  Rx sent to pharmacy.

## 2020-08-27 NOTE — Progress Notes (Signed)
Acute Office Visit  Subjective:    Patient ID: Leslie Abbott, female    DOB: [birth year], 55 y.o.   MRN: [id number]  Chief Complaint  Patient presents with  . Stress    Patient states she has been more stressed since NOV    HPI   Acute stress disorder  Patient is reporting worsening stress due to current family dynamics.  Patient is crying often and feels overwhelmed.   Concerning patient's anxiety: Patient complains of anxiety disorder.  She has the following symptoms: difficulty concentrating, irritable. Onset of symptoms was approximately 3 years ago, gradually worsening since that time. She denies current suicidal and homicidal ideation. Family history significant for no psychiatric illness.Possible organic causes contributing are: none. Risk factors: previous episode of depression Previous treatment includes hydroxyzine.  She complains of the following side effects from the treatment: none.  Depression: Patient complains of depression. She complains of depressed mood. Onset was approximately a few months ago, unchanged since that time.  She denies current suicidal and homicidal plan or intent.   Family history significant for no psychiatric illness.Possible organic causes contributing are: none.  Risk factors: Acute stress disorder Previous treatment includes none . She complains of the following side effects from the treatment: none.   Past Medical History:  Diagnosis Date  . Anxiety   . Depression     Past Surgical History:  Procedure Laterality Date  . birth mark     birth mark removal 40 years ago  . TUBAL LIGATION      Family History  Problem Relation Age of Onset  . Cancer Mother        lung  . Heart disease Father        heart attack    Social History   Socioeconomic History  . Marital status: Married    Spouse name: Not on file  . Number of children: Not on file  . Years of education: Not on file  . Highest education level: Not on file  Occupational  History  . Not on file  Tobacco Use  . Smoking status: Former Smoker    Packs/day: 1.00    Years: 35.00    Pack years: 35.00    Types: Cigarettes  . Smokeless tobacco: Current User  Substance and Sexual Activity  . Alcohol use: Yes    Alcohol/week: 3.0 standard drinks    Types: 3 Cans of beer per week  . Drug use: No  . Sexual activity: Not on file  Other Topics Concern  . Not on file  Social History Narrative  . Not on file   Social Determinants of Health   Financial Resource Strain: Not on file  Food Insecurity: Not on file  Transportation Needs: Not on file  Physical Activity: Not on file  Stress: Not on file  Social Connections: Not on file  Intimate Partner Violence: Not on file    Outpatient Medications Prior to Visit  Medication Sig Dispense Refill  . Ascorbic Acid (VITAMIN C) 100 MG tablet Take 100 mg by mouth daily.    . cholecalciferol (VITAMIN D3) 25 MCG (1000 UT) tablet Take 2,000 Units by mouth daily.    . Omega 3 1000 MG CAPS Take by mouth.    . zinc gluconate 50 MG tablet Take 50 mg by mouth daily.     No facility-administered medications prior to visit.    Review of Systems  Constitutional: Negative.   HENT: Negative.   Respiratory: Negative.   Cardiovascular:  Negative.   Genitourinary: Negative.   Musculoskeletal: Negative.   Skin: Negative.   Psychiatric/Behavioral: Positive for decreased concentration. Negative for self-injury, sleep disturbance and suicidal ideas. The patient is nervous/anxious.   All other systems reviewed and are negative.      Objective:    Physical Exam Vitals reviewed.  Constitutional:      Appearance: Normal appearance.  HENT:     Head: Normocephalic.     Nose: Nose normal.  Eyes:     Conjunctiva/sclera: Conjunctivae normal.  Cardiovascular:     Pulses: Normal pulses.     Heart sounds: Normal heart sounds.  Pulmonary:     Effort: Pulmonary effort is normal.     Breath sounds: Normal breath sounds.   Abdominal:     General: Bowel sounds are normal.  Musculoskeletal:        General: Normal range of motion.  Skin:    General: Skin is warm.  Neurological:     Mental Status: She is alert and oriented to person, place, and time.  Psychiatric:     Comments: Positive for acute stress disorder, anxiety and depression.     BP (!) 147/95   Pulse 85   Temp (!) 97 F (36.1 C) (Temporal)   Ht 5\' 4"  (1.626 m)   Wt 204 lb 9.6 oz (92.8 kg)   SpO2 98%   BMI 35.12 kg/m  Wt Readings from Last 3 Encounters:  08/27/20 204 lb 9.6 oz (92.8 kg)  05/17/20 206 lb (93.4 kg)  03/23/19 204 lb (92.5 kg)    Health Maintenance Due  Topic Date Due  . Hepatitis C Screening  Never done  . COVID-19 Vaccine (1) Never done  . HIV Screening  Never done  . MAMMOGRAM  08/09/2017  . PAP SMEAR-Modifier  08/09/2018     Lab Results  Component Value Date   TSH 1.550 03/23/2019    Lab Results  Component Value Date   NA 141 05/17/2020   K 4.9 05/17/2020   CO2 26 05/17/2020   GLUCOSE 93 05/17/2020   BUN 16 05/17/2020   CREATININE 0.80 05/17/2020   BILITOT 0.7 05/17/2020   ALKPHOS 102 05/17/2020   AST 15 05/17/2020   ALT 14 05/17/2020   PROT 6.6 05/17/2020   ALBUMIN 4.6 05/17/2020   CALCIUM 9.7 05/17/2020   Lab Results  Component Value Date   CHOL 236 (H) 05/17/2020   Lab Results  Component Value Date   HDL 41 05/17/2020   Lab Results  Component Value Date   LDLCALC 139 (H) 05/17/2020   Lab Results  Component Value Date   TRIG 311 (H) 05/17/2020   Lab Results  Component Value Date   CHOLHDL 5.8 (H) 05/17/2020   Lab Results  Component Value Date   HGBA1C 5.7 05/25/2017   Flowsheet Row Office Visit from 08/27/2020 in [facility]  PHQ-9 Total Score 13     GAD 7 : Generalized Anxiety Score 08/27/2020 03/23/2019 11/27/2015  Nervous, Anxious, on Edge 3 1 3   Control/stop worrying 3 1 3   Worry too much - different things 3 1 3   Trouble relaxing 3 0 3   Restless 2 0 3  Easily annoyed or irritable 3 1 3   Afraid - awful might happen 2 1 1   Total GAD 7 Score 19 5 19   Anxiety Difficulty - - Very difficult       Assessment & Plan:  Depression, major, single episode, moderate (HCC) Depression not well controlled.  Completed PHQ-9.  Start the patient on Lexapro 10 mg tablet by mouth once daily.  Follow-up in 4 to 6 weeks.  Provided education with printed handouts given.  Rx sent to pharmacy.    Acute stress disorder Symptoms not well controlled.  This is new for patient in the last few weeks due to situational changes in her family.  Provided education to patient with printed handouts given.  Hydroxyzine as needed ordered. Rx sent to pharmacy.  Follow-up with worsening unresolved symptoms.  Problem List Items Addressed This Visit      Other   GAD (generalized anxiety disorder)   Relevant Medications   escitalopram (LEXAPRO) 10 MG tablet   hydrOXYzine (ATARAX/VISTARIL) 10 MG tablet   Acute stress disorder - Primary   Relevant Medications   escitalopram (LEXAPRO) 10 MG tablet   hydrOXYzine (ATARAX/VISTARIL) 10 MG tablet   Depression, major, single episode, moderate (HCC)   Relevant Medications   escitalopram (LEXAPRO) 10 MG tablet   hydrOXYzine (ATARAX/VISTARIL) 10 MG tablet       Meds ordered this encounter  Medications  . escitalopram (LEXAPRO) 10 MG tablet    Sig: Take 1 tablet (10 mg total) by mouth daily.    Dispense:  60 tablet    Refill:  0    Order Specific Question:   Supervising Provider    Answer:   Raliegh Ip [[id number]]  . hydrOXYzine (ATARAX/VISTARIL) 10 MG tablet    Sig: Take 1 tablet (10 mg total) by mouth 3 (three) times daily as needed for anxiety.    Dispense:  30 tablet    Refill:  0    Order Specific Question:   Supervising Provider    Answer:   Raliegh Ip [[id number]]     Daryll Drown, NP

## 2020-10-08 ENCOUNTER — Other Ambulatory Visit: Payer: Self-pay

## 2020-10-08 ENCOUNTER — Encounter: Payer: Self-pay | Admitting: Nurse Practitioner

## 2020-10-08 ENCOUNTER — Ambulatory Visit: Payer: Managed Care, Other (non HMO) | Admitting: Nurse Practitioner

## 2020-10-08 ENCOUNTER — Ambulatory Visit (INDEPENDENT_AMBULATORY_CARE_PROVIDER_SITE_OTHER): Payer: Managed Care, Other (non HMO) | Admitting: Nurse Practitioner

## 2020-10-08 VITALS — BP 135/81 | HR 67 | Temp 97.1°F | Ht 64.0 in | Wt 207.6 lb

## 2020-10-08 DIAGNOSIS — Z720 Tobacco use: Secondary | ICD-10-CM | POA: Diagnosis not present

## 2020-10-08 DIAGNOSIS — F321 Major depressive disorder, single episode, moderate: Secondary | ICD-10-CM | POA: Diagnosis not present

## 2020-10-08 DIAGNOSIS — F411 Generalized anxiety disorder: Secondary | ICD-10-CM

## 2020-10-08 MED ORDER — ESCITALOPRAM OXALATE 10 MG PO TABS: 10.0000 mg | ORAL_TABLET | Freq: Every day | ORAL | 0 refills | Status: DC

## 2020-10-08 NOTE — Assessment & Plan Note (Signed)
Patient not ready for tobacco cessation to start. Education provided with counseling. Programme researcher, broadcasting/film/video given.

## 2020-10-08 NOTE — Assessment & Plan Note (Signed)
Depression well managed on Lexapro 10 mg tablet daily. Completed PHQ-9.  Rx refill sent to pharmacy.

## 2020-10-08 NOTE — Assessment & Plan Note (Signed)
Anxiety well managed on 10 mg Lexapro by mouth daily. Rx refill sent to pharmacy. Education provided with printed handouts given.

## 2020-10-08 NOTE — Progress Notes (Signed)
Established Patient Office Visit  Subjective:  Patient ID: Leslie Abbott, female    DOB: 11-23-1965  Age: 55 y.o. MRN: [id number]  CC:  Chief Complaint  Patient presents with  . Follow-up    stress    HPI Leslie Abbott presents for Depression: Patient complains of depression. She complains of depressed mood. Onset was approximately 3 years ago, gradually improving since that time.  She denies current suicidal and homicidal plan or intent.   Family history significant for no psychiatric illness.Possible organic causes contributing are: none.  Risk factors: previous episode of depression Previous treatment includes Lexapro. She complains of the following side effects from the treatment: none.  Anxiety: Patient complains of anxiety disorder.  She has the following symptoms: difficulty concentrating, feelings of losing control. Onset of symptoms was approximately 3 years ago, gradually improving since that time. She denies current suicidal and homicidal ideation. Family history significant for no psychiatric illness.Possible organic causes contributing are: none. Risk factors: previous episode of depression Previous treatment includes Lexapro.  She complains of the following side effects from the treatment: none.  Smoking cessation instruction/counseling given:  counseled patient on the dangers of tobacco use, advised patient to stop smoking, and reviewed strategies to maximize success  Past Medical History:  Diagnosis Date  . Anxiety   . Depression     Past Surgical History:  Procedure Laterality Date  . birth mark     birth mark removal 40 years ago  . TUBAL LIGATION      Family History  Problem Relation Age of Onset  . Cancer Mother        lung  . Heart disease Father        heart attack    Social History   Socioeconomic History  . Marital status: Married    Spouse name: Not on file  . Number of children: Not on file  . Years of education: Not on file  . Highest education  level: Not on file  Occupational History  . Not on file  Tobacco Use  . Smoking status: Former Smoker    Packs/day: 1.00    Years: 35.00    Pack years: 35.00    Types: Cigarettes  . Smokeless tobacco: Current User  Substance and Sexual Activity  . Alcohol use: Yes    Alcohol/week: 3.0 standard drinks    Types: 3 Cans of beer per week  . Drug use: No  . Sexual activity: Not on file  Other Topics Concern  . Not on file  Social History Narrative  . Not on file   Social Determinants of Health   Financial Resource Strain: Not on file  Food Insecurity: Not on file  Transportation Needs: Not on file  Physical Activity: Not on file  Stress: Not on file  Social Connections: Not on file  Intimate Partner Violence: Not on file    Outpatient Medications Prior to Visit  Medication Sig Dispense Refill  . Ascorbic Acid (VITAMIN C) 100 MG tablet Take 100 mg by mouth daily.    . cholecalciferol (VITAMIN D3) 25 MCG (1000 UT) tablet Take 2,000 Units by mouth daily.    . hydrOXYzine (ATARAX/VISTARIL) 10 MG tablet Take 1 tablet (10 mg total) by mouth 3 (three) times daily as needed for anxiety. 30 tablet 0  . Omega 3 1000 MG CAPS Take by mouth.    . zinc gluconate 50 MG tablet Take 50 mg by mouth daily.    Marland Kitchen escitalopram (LEXAPRO) 10 MG  tablet Take 1 tablet (10 mg total) by mouth daily. 60 tablet 0   No facility-administered medications prior to visit.    No Known Allergies  ROS Review of Systems  Constitutional: Negative.   HENT: Negative.   Eyes: Negative.   Respiratory: Negative.   Cardiovascular: Negative.   Gastrointestinal: Negative.   Genitourinary: Negative.   Musculoskeletal: Negative.   Skin: Negative.   Neurological: Negative.   Psychiatric/Behavioral: Negative for self-injury, sleep disturbance and suicidal ideas. The patient is nervous/anxious.   All other systems reviewed and are negative.     Objective:    Physical Exam Vitals reviewed.  Constitutional:       General: She is awake.     Appearance: Normal appearance. She is well-groomed.     Interventions: Face mask in place.  HENT:     Head: Normocephalic.     Nose: Nose normal.  Eyes:     Conjunctiva/sclera: Conjunctivae normal.  Cardiovascular:     Rate and Rhythm: Normal rate and regular rhythm.     Pulses: Normal pulses.     Heart sounds: Normal heart sounds.  Pulmonary:     Effort: Pulmonary effort is normal.     Breath sounds: Normal breath sounds.  Abdominal:     General: Bowel sounds are normal.  Musculoskeletal:        General: Normal range of motion.  Skin:    General: Skin is warm.  Neurological:     Mental Status: She is alert and oriented to person, place, and time.  Psychiatric:        Behavior: Behavior is cooperative.     Comments: Anxiety and depression     BP 135/81   Pulse 67   Temp (!) 97.1 F (36.2 C)   Ht 5\' 4"  (1.626 m)   Wt 207 lb 9.6 oz (94.2 kg)   SpO2 98%   BMI 35.63 kg/m  Wt Readings from Last 3 Encounters:  10/08/20 207 lb 9.6 oz (94.2 kg)  08/27/20 204 lb 9.6 oz (92.8 kg)  05/17/20 206 lb (93.4 kg)     Health Maintenance Due  Topic Date Due  . Hepatitis C Screening  Never done  . HIV Screening  Never done  . MAMMOGRAM  08/09/2017  . PAP SMEAR-Modifier  08/09/2018    There are no preventive care reminders to display for this patient.  Lab Results  Component Value Date   TSH 1.550 03/23/2019   Lab Results  Component Value Date   WBC 8.6 05/17/2020   HGB 15.5 05/17/2020   HCT 45.4 05/17/2020   MCV 93 05/17/2020   PLT 224 05/17/2020   Lab Results  Component Value Date   NA 141 05/17/2020   K 4.9 05/17/2020   CO2 26 05/17/2020   GLUCOSE 93 05/17/2020   BUN 16 05/17/2020   CREATININE 0.80 05/17/2020   BILITOT 0.7 05/17/2020   ALKPHOS 102 05/17/2020   AST 15 05/17/2020   ALT 14 05/17/2020   PROT 6.6 05/17/2020   ALBUMIN 4.6 05/17/2020   CALCIUM 9.7 05/17/2020   Lab Results  Component Value Date   CHOL 236 (H)  05/17/2020   Lab Results  Component Value Date   HDL 41 05/17/2020   Lab Results  Component Value Date   LDLCALC 139 (H) 05/17/2020   Lab Results  Component Value Date   TRIG 311 (H) 05/17/2020   Lab Results  Component Value Date   CHOLHDL 5.8 (H) 05/17/2020   Lab Results  Component Value Date   HGBA1C 5.7 05/25/2017      Assessment & Plan:   Problem List Items Addressed This Visit      Other   Tobacco abuse    Patient not ready for tobacco cessation to start. Education provided with counseling. Programme researcher, broadcasting/film/video given.      GAD (generalized anxiety disorder) - Primary    Anxiety well managed on 10 mg Lexapro by mouth daily. Rx refill sent to pharmacy. Education provided with printed handouts given.        Relevant Medications   escitalopram (LEXAPRO) 10 MG tablet   Depression, major, single episode, moderate (HCC)    Depression well managed on Lexapro 10 mg tablet daily. Completed PHQ-9.  Rx refill sent to pharmacy.      Relevant Medications   escitalopram (LEXAPRO) 10 MG tablet      Meds ordered this encounter  Medications  . escitalopram (LEXAPRO) 10 MG tablet    Sig: Take 1 tablet (10 mg total) by mouth daily.    Dispense:  90 tablet    Refill:  0    Follow-up: Return in about 3 months (around 01/05/2021).    Daryll Drown, NP

## 2020-10-08 NOTE — Patient Instructions (Addendum)
Patient at this time is not ready to start smoking cessation, doing well on Lexapro 10 mg tablet by mouth daily no changes necessary.  Refill sent to pharmacy.  Next appointment for complete physical exam that will include mammogram, Pap, blood work-hepatitis C, HIV, CBC, CMP, and lipids.  [email or web address]  Generalized Anxiety Disorder, Adult Generalized anxiety disorder (GAD) is a mental health condition. Unlike normal worries, anxiety related to GAD is not triggered by a specific event. These worries do not fade or get better with time. GAD interferes with relationships, work, and school. GAD symptoms can vary from mild to severe. People with severe GAD can have intense waves of anxiety with physical symptoms that are similar to panic attacks. What are the causes? The exact cause of GAD is not known, but the following are believed to have an impact:  Differences in natural brain chemicals.  Genes passed down from parents to children.  Differences in the way threats are perceived.  Development during childhood.  Personality. What increases the risk? The following factors may make you more likely to develop this condition:  Being female.  Having a family history of anxiety disorders.  Being very shy.  Experiencing very stressful life events, such as the death of a loved one.  Having a very stressful family environment. What are the signs or symptoms? People with GAD often worry excessively about many things in their lives, such as their health and family. Symptoms may also include:  Mental and emotional symptoms: ? Worrying excessively about natural disasters. ? Fear of being late. ? Difficulty concentrating. ? Fears that others are judging your performance.  Physical symptoms: ? Fatigue. ? Headaches, muscle tension, muscle twitches, trembling, or feeling shaky. ? Feeling like your heart is pounding or beating very fast. ? Feeling out of breath or like you cannot take  a deep breath. ? Having trouble falling asleep or staying asleep, or experiencing restlessness. ? Sweating. ? Nausea, diarrhea, or irritable bowel syndrome (IBS).  Behavioral symptoms: ? Experiencing erratic moods or irritability. ? Avoidance of new situations. ? Avoidance of people. ? Extreme difficulty making decisions. How is this diagnosed? This condition is diagnosed based on your symptoms and medical history. You will also have a physical exam. Your health care provider may perform tests to rule out other possible causes of your symptoms. To be diagnosed with GAD, a person must have anxiety that:  Is out of his or her control.  Affects several different aspects of his or her life, such as work and relationships.  Causes distress that makes him or her unable to take part in normal activities.  Includes at least three symptoms of GAD, such as restlessness, fatigue, trouble concentrating, irritability, muscle tension, or sleep problems. Before your health care provider can confirm a diagnosis of GAD, these symptoms must be present more days than they are not, and they must last for 6 months or longer. How is this treated? This condition may be treated with:  Medicine. Antidepressant medicine is usually prescribed for long-term daily control. Anti-anxiety medicines may be added in severe cases, especially when panic attacks occur.  Talk therapy (psychotherapy). Certain types of talk therapy can be helpful in treating GAD by providing support, education, and guidance. Options include: ? Cognitive behavioral therapy (CBT). People learn coping skills and self-calming techniques to ease their physical symptoms. They learn to identify unrealistic thoughts and behaviors and to replace them with more appropriate thoughts and behaviors. ? Acceptance and commitment therapy (ACT). This  treatment teaches people how to be mindful as a way to cope with unwanted thoughts and  feelings. ? Biofeedback. This process trains you to manage your body's response (physiological response) through breathing techniques and relaxation methods. You will work with a therapist while machines are used to monitor your physical symptoms.  Stress management techniques. These include yoga, meditation, and exercise. A mental health specialist can help determine which treatment is best for you. Some people see improvement with one type of therapy. However, other people require a combination of therapies.   Follow these instructions at home: Lifestyle  Maintain a consistent routine and schedule.  Anticipate stressful situations. Create a plan, and allow extra time to work with your plan.  Practice stress management or self-calming techniques that you have learned from your therapist or your health care provider. General instructions  Take over-the-counter and prescription medicines only as told by your health care provider.  Understand that you are likely to have setbacks. Accept this and be kind to yourself as you persist to take better care of yourself.  Recognize and accept your accomplishments, even if you judge them as small.  Keep all follow-up visits as told by your health care provider. This is important. Contact a health care provider if:  Your symptoms do not get better.  Your symptoms get worse.  You have signs of depression, such as: ? A persistently sad or irritable mood. ? Loss of enjoyment in activities that used to bring you joy. ? Change in weight or eating. ? Changes in sleeping habits. ? Avoiding friends or family members. ? Loss of energy for normal tasks. ? Feelings of guilt or worthlessness. Get help right away if:  You have serious thoughts about hurting yourself or others. If you ever feel like you may hurt yourself or others, or have thoughts about taking your own life, get help right away. Go to your nearest emergency department or:  Call your  local emergency services (911 in the [place]  Call a suicide crisis helpline, such as the National Suicide Prevention Lifeline at 406 643 0945. This is open 24 hours a day in the [place]  Text the Crisis Text Line at (250)354-0056 (in the [place] Summary  Generalized anxiety disorder (GAD) is a mental health condition that involves worry that is not triggered by a specific event.  People with GAD often worry excessively about many things in their lives, such as their health and family.  GAD may cause symptoms such as restlessness, trouble concentrating, sleep problems, frequent sweating, nausea, diarrhea, headaches, and trembling or muscle twitching.  A mental health specialist can help determine which treatment is best for you. Some people see improvement with one type of therapy. However, other people require a combination of therapies. This information is not intended to replace advice given to you by your health care provider. Make sure you discuss any questions you have with your health care provider. Document Revised: 05/25/2019 Document Reviewed: 05/25/2019 Elsevier Patient Education  2021 Elsevier Inc. [email or web address]  Depression Screening Depression screening is a tool that your health care provider can use to learn if you have symptoms of depression. Depression is a common condition with many symptoms that are also often found in other conditions. Depression is treatable, but it must first be diagnosed. You may not know that certain feelings, thoughts, and behaviors that you are having can be symptoms of depression. Taking a depression screening test can help you and your health care provider decide if you need more  assessment, or if you should be referred to a mental health care provider. What are the screening tests?  You may have a physical exam to see if another condition is affecting your mental health. You may  have a blood or urine sample taken during the physical exam.  You may be interviewed using a screening tool that was developed from research, such as one of these: ? Patient Health Questionnaire (PHQ). This is a set of either 2 or 9 questions. A health care provider who has been trained to score this screening test uses a guide to assess if your symptoms suggest that you may have depression. ? Hamilton Depression Rating Scale (HAM-D). This is a set of either 17 or 24 questions. You may be asked to take it again during or after your treatment, to see if your depression has gotten better. ? Beck Depression Inventory (BDI). This is a set of 21 multiple choice questions. Your health care provider scores your answers to assess:  Your level of depression, ranging from mild to severe.  Your response to treatment.  Your health care provider may talk with you about your daily activities, such as eating, sleeping, work, and recreation, and ask if you have had any changes in activity.  Your health care provider may ask you to see a mental health specialist, such as a psychiatrist or psychologist, for more evaluation. Who should be screened for depression?  All adults, including adults with a family history of a mental health disorder.  Adolescents who are 7-70 years old.  People who are recovering from a myocardial infarction (MI).  Pregnant women, or women who have given birth.  People who have a long-term (chronic) illness.  Anyone who has been diagnosed with another type of a mental health disorder.  Anyone who has symptoms that could show depression.   What do my results mean? Your health care provider will review the results of your depression screening, physical exam, and lab tests. Positive screens suggest that you may have depression. Screening is the first step in getting the care that you may need. It is up to you to get your screening results. Ask your health care provider, or the  department that is doing your screening tests, when your results will be ready. Talk with your health care provider about your results and diagnosis. A diagnosis of depression is made using the Diagnostic and Statistical Manual of Mental Disorders (DSM-V). This is a book that lists the number and type of symptoms that must be present for a health care provider to give a specific diagnosis.  Your health care provider may work with you to treat your symptoms of depression, or your health care provider may help you find a mental health provider who can assess, diagnose, and treat your depression. Get help right away if:  You have thoughts about hurting yourself or others. If you ever feel like you may hurt yourself or others, or have thoughts about taking your own life, get help right away. You can go to your nearest emergency department or call:  Your local emergency services (911 in the [place]  A suicide crisis helpline, such as the National Suicide Prevention Lifeline at 317 839 4416. This is open 24 hours a day. Summary  Depression screening is the first step in getting the help that you may need.  If your screening test shows symptoms of depression (is positive), your health care provider may ask you to see a mental health provider.  Anyone who  is age 79 or older should be screened for depression. This information is not intended to replace advice given to you by your health care provider. Make sure you discuss any questions you have with your health care provider. Document Revised: 01/26/2020 Document Reviewed: 01/26/2020 Elsevier Patient Education  2021 Elsevier Inc.  Smoking Tobacco Information, Adult Smoking tobacco can be harmful to your health. Tobacco contains a poisonous (toxic), colorless chemical called nicotine. Nicotine is addictive. It changes the brain and can make it hard to stop smoking. Tobacco also has other toxic chemicals that can hurt your body and raise your risk of  many cancers. How can smoking tobacco affect me? Smoking tobacco puts you at risk for:  Cancer. Smoking is most commonly associated with lung cancer, but can also lead to cancer in other parts of the body.  Chronic obstructive pulmonary disease (COPD). This is a long-term lung condition that makes it hard to breathe. It also gets worse over time.  High blood pressure (hypertension), heart disease, stroke, or heart attack.  Lung infections, such as pneumonia.  Cataracts. This is when the lenses in the eyes become clouded.  Digestive problems. This may include peptic ulcers, heartburn, and gastroesophageal reflux disease (GERD).  Oral health problems, such as gum disease and tooth loss.  Loss of taste and smell. Smoking can affect your appearance by causing:  Wrinkles.  Yellow or stained teeth, fingers, and fingernails. Smoking tobacco can also affect your social life, because:  It may be challenging to find places to smoke when away from home. Many workplaces, [employer], hotels, and public places are tobacco-free.  Smoking is expensive. This is due to the cost of tobacco and the long-term costs of treating health problems from smoking.  Secondhand smoke may affect those around you. Secondhand smoke can cause lung cancer, breathing problems, and heart disease. Children of smokers have a higher risk for: ? Sudden infant death syndrome (SIDS). ? Ear infections. ? Lung infections. If you currently smoke tobacco, quitting now can help you:  Lead a longer and healthier life.  Look, smell, breathe, and feel better over time.  Save money.  Protect others from the harms of secondhand smoke. What actions can I take to prevent health problems? Quit smoking  Do not start smoking. Quit if you already do.  Make a plan to quit smoking and commit to it. Look for programs to help you and ask your health care provider for recommendations and ideas.  Set a date and write down all the  reasons you want to quit.  Let your friends and family know you are quitting so they can help and support you. Consider finding friends who also want to quit. It can be easier to quit with someone else, so that you can support each other.  Talk with your health care provider about using nicotine replacement medicines to help you quit, such as gum, lozenges, patches, sprays, or pills.  Do not replace cigarette smoking with electronic cigarettes, which are commonly called e-cigarettes. The safety of e-cigarettes is not known, and some may contain harmful chemicals.  If you try to quit but return to smoking, stay positive. It is common to slip up when you first quit, so take it one day at a time.  Be prepared for cravings. When you feel the urge to smoke, chew gum or suck on hard candy.   Lifestyle  Stay busy and take care of your body.  Drink enough fluid to keep your urine pale  yellow.  Get plenty of exercise and eat a healthy diet. This can help prevent weight gain after quitting.  Monitor your eating habits. Quitting smoking can cause you to have a larger appetite than when you smoke.  Find ways to relax. Go out with friends or family to a movie or a restaurant where people do not smoke.  Ask your health care provider about having regular tests (screenings) to check for cancer. This may include blood tests, imaging tests, and other tests.  Find ways to manage your stress, such as meditation, yoga, or exercise. Where to find support To get support to quit smoking, consider:  Asking your health care provider for more information and resources.  Taking classes to learn more about quitting smoking.  Looking for local organizations that offer resources about quitting smoking.  Joining a support group for people who want to quit smoking in your local community.  Calling the smokefree.gov counselor helpline: 1-800-Quit-Now 9855250040) Where to find more information You may find  more information about quitting smoking from:  HelpGuide.org: www.helpguide.org  BankRights.uy: smokefree.gov  American Lung Association: www.lung.org Contact a health care provider if you:  Have problems breathing.  Notice that your lips, nose, or fingers turn blue.  Have chest pain.  Are coughing up blood.  Feel faint or you pass out.  Have other health changes that cause you to worry. Summary  Smoking tobacco can negatively affect your health, the health of those around you, your finances, and your social life.  Do not start smoking. Quit if you already do. If you need help quitting, ask your health care provider.  Think about joining a support group for people who want to quit smoking in your local community. There are many effective programs that will help you to quit this behavior. This information is not intended to replace advice given to you by your health care provider. Make sure you discuss any questions you have with your health care provider. Document Revised: 04/29/2019 Document Reviewed: 08/19/2016 Elsevier Patient Education  2021 [employer].

## 2020-11-28 ENCOUNTER — Ambulatory Visit (INDEPENDENT_AMBULATORY_CARE_PROVIDER_SITE_OTHER): Payer: Managed Care, Other (non HMO) | Admitting: Nurse Practitioner

## 2020-11-28 ENCOUNTER — Encounter: Payer: Self-pay | Admitting: Nurse Practitioner

## 2020-11-28 ENCOUNTER — Other Ambulatory Visit: Payer: Self-pay

## 2020-11-28 VITALS — BP 119/82 | HR 85 | Temp 97.8°F | Ht 64.0 in | Wt 205.0 lb

## 2020-11-28 DIAGNOSIS — R197 Diarrhea, unspecified: Secondary | ICD-10-CM | POA: Diagnosis not present

## 2020-11-28 NOTE — Patient Instructions (Signed)

## 2020-11-28 NOTE — Progress Notes (Signed)
Acute Office Visit  Subjective:    Patient ID: Leslie Abbott, female    DOB: 1965-11-27, 55 y.o.   MRN: [id number]  Chief Complaint  Patient presents with  . Diarrhea    Diarrhea  This is a recurrent problem. The current episode started more than 1 month ago. The problem occurs 2 to 4 times per day. The problem has been unchanged. The stool consistency is described as watery. The patient states that diarrhea does not awaken her from sleep. Pertinent negatives include no abdominal pain, bloating, chills, coughing, fever, headaches, sweats or vomiting. Nothing aggravates the symptoms. There are no known risk factors. She has tried anti-motility drug for the symptoms. The treatment provided mild relief.     Past Medical History:  Diagnosis Date  . Anxiety   . Depression     Past Surgical History:  Procedure Laterality Date  . birth mark     birth mark removal 40 years ago  . TUBAL LIGATION      Family History  Problem Relation Age of Onset  . Cancer Mother        lung  . Heart disease Father        heart attack    Social History   Socioeconomic History  . Marital status: Married    Spouse name: Not on file  . Number of children: Not on file  . Years of education: Not on file  . Highest education level: Not on file  Occupational History  . Not on file  Tobacco Use  . Smoking status: Former Smoker    Packs/day: 1.00    Years: 35.00    Pack years: 35.00    Types: Cigarettes  . Smokeless tobacco: Current User  Substance and Sexual Activity  . Alcohol use: Yes    Alcohol/week: 3.0 standard drinks    Types: 3 Cans of beer per week  . Drug use: No  . Sexual activity: Not on file  Other Topics Concern  . Not on file  Social History Narrative  . Not on file   Social Determinants of Health   Financial Resource Strain: Not on file  Food Insecurity: Not on file  Transportation Needs: Not on file  Physical Activity: Not on file  Stress: Not on file  Social  Connections: Not on file  Intimate Partner Violence: Not on file    Outpatient Medications Prior to Visit  Medication Sig Dispense Refill  . Ascorbic Acid (VITAMIN C) 100 MG tablet Take 100 mg by mouth daily.    . cholecalciferol (VITAMIN D3) 25 MCG (1000 UT) tablet Take 2,000 Units by mouth daily.    Marland Kitchen escitalopram (LEXAPRO) 10 MG tablet Take 1 tablet (10 mg total) by mouth daily. 90 tablet 0  . hydrOXYzine (ATARAX/VISTARIL) 10 MG tablet Take 1 tablet (10 mg total) by mouth 3 (three) times daily as needed for anxiety. 30 tablet 0  . Omega 3 1000 MG CAPS Take by mouth.    . zinc gluconate 50 MG tablet Take 50 mg by mouth daily.     No facility-administered medications prior to visit.    No Known Allergies  Review of Systems  Constitutional: Negative for chills and fever.  Respiratory: Negative for cough.   Gastrointestinal: Positive for diarrhea. Negative for abdominal pain, bloating and vomiting.  Neurological: Negative for headaches.  All other systems reviewed and are negative.      Objective:    Physical Exam Vitals and nursing note reviewed.  Constitutional:  Appearance: Normal appearance.  HENT:     Head: Normocephalic.     Nose: Nose normal.  Eyes:     Conjunctiva/sclera: Conjunctivae normal.  Cardiovascular:     Rate and Rhythm: Normal rate and regular rhythm.     Pulses: Normal pulses.     Heart sounds: Normal heart sounds.  Pulmonary:     Effort: Pulmonary effort is normal.     Breath sounds: Normal breath sounds.  Abdominal:     General: There is no distension.     Tenderness: There is no abdominal tenderness. There is no right CVA tenderness, left CVA tenderness or guarding.  Skin:    General: Skin is warm.  Neurological:     Mental Status: She is alert and oriented to person, place, and time.     BP 119/82   Pulse 85   Temp 97.8 F (36.6 C) (Temporal)   Ht 5\' 4"  (1.626 m)   Wt 205 lb (93 kg)   SpO2 94%   BMI 35.19 kg/m  Wt Readings from  Last 3 Encounters:  11/28/20 205 lb (93 kg)  10/08/20 207 lb 9.6 oz (94.2 kg)  08/27/20 204 lb 9.6 oz (92.8 kg)    Health Maintenance Due  Topic Date Due  . Hepatitis C Screening  Never done  . HIV Screening  Never done  . MAMMOGRAM  08/09/2017  . PAP SMEAR-Modifier  08/09/2018    There are no preventive care reminders to display for this patient.   Lab Results  Component Value Date   TSH 1.550 03/23/2019   Lab Results  Component Value Date   WBC 8.6 05/17/2020   HGB 15.5 05/17/2020   HCT 45.4 05/17/2020   MCV 93 05/17/2020   PLT 224 05/17/2020   Lab Results  Component Value Date   NA 141 05/17/2020   K 4.9 05/17/2020   CO2 26 05/17/2020   GLUCOSE 93 05/17/2020   BUN 16 05/17/2020   CREATININE 0.80 05/17/2020   BILITOT 0.7 05/17/2020   ALKPHOS 102 05/17/2020   AST 15 05/17/2020   ALT 14 05/17/2020   PROT 6.6 05/17/2020   ALBUMIN 4.6 05/17/2020   CALCIUM 9.7 05/17/2020   Lab Results  Component Value Date   CHOL 236 (H) 05/17/2020   Lab Results  Component Value Date   HDL 41 05/17/2020   Lab Results  Component Value Date   LDLCALC 139 (H) 05/17/2020   Lab Results  Component Value Date   TRIG 311 (H) 05/17/2020   Lab Results  Component Value Date   CHOLHDL 5.8 (H) 05/17/2020   Lab Results  Component Value Date   HGBA1C 5.7 05/25/2017       Assessment & Plan:   Problem List Items Addressed This Visit      Other   Diarrhea - Primary    Diarrhea not well controlled.  Symptoms present for 4 weeks.  Patient is having loose stools and sometimes fatty stools after every meal.  She denies fever chills, abdominal pain or nausea. Patient has had an abnormal colonoscopy in the past with polyps.  Patient is not reporting any signs and symptoms of infection or stomach virus.  Completed celiac panel, lactulose intolerance testing, CBC and CMP.  Education provided to patient to keep hydrated with electrolytes, a brat diet, follow-up with worsening  unresolved symptoms.  We will treat appropriately based on lab results..       Relevant Orders   Celiac Panel   Endomysial ab  scrn + titer, IgA   Lactose tolerance test   Comprehensive metabolic panel   CBC with Differential       No orders of the defined types were placed in this encounter.    Daryll Drown, NP

## 2020-11-28 NOTE — Assessment & Plan Note (Signed)
Diarrhea not well controlled.  Symptoms present for 4 weeks.  Patient is having loose stools and sometimes fatty stools after every meal.  She denies fever chills, abdominal pain or nausea. Patient has had an abnormal colonoscopy in the past with polyps.  Patient is not reporting any signs and symptoms of infection or stomach virus.  Completed celiac panel, lactulose intolerance testing, CBC and CMP.  Education provided to patient to keep hydrated with electrolytes, a brat diet, follow-up with worsening unresolved symptoms.  We will treat appropriately based on lab results.Marland Kitchen

## 2020-12-03 ENCOUNTER — Telehealth: Payer: Self-pay

## 2020-12-03 NOTE — Telephone Encounter (Signed)
We do not have the stuff to drink for that test. What would you like for patient to do? Please advise

## 2020-12-03 NOTE — Telephone Encounter (Signed)
We can cancel the test and wait for the other results to come back and go from there.  If symptoms are not resolved

## 2020-12-03 NOTE — Telephone Encounter (Signed)
LMTCB

## 2020-12-04 NOTE — Addendum Note (Signed)
Addended byDory Peru on: 12/04/2020 11:54 AM   Modules accepted: Orders

## 2020-12-05 NOTE — Telephone Encounter (Signed)
Pt coming 12/07/20 for labs

## 2020-12-07 ENCOUNTER — Other Ambulatory Visit: Payer: Managed Care, Other (non HMO)

## 2020-12-07 ENCOUNTER — Other Ambulatory Visit: Payer: Self-pay

## 2020-12-10 LAB — LACTOSE TOLERANCE TEST
Glucose, 1 hour: 86 mg/dL
Glucose: 104 mg/dL
Glucose: 84 mg/dL
Glucose: 89 mg/dL
Glucose: 94 mg/dL

## 2020-12-10 LAB — COMPREHENSIVE METABOLIC PANEL
ALT: 20 IU/L (ref 0–32)
AST: 20 IU/L (ref 0–40)
Albumin/Globulin Ratio: 2 (ref 1.2–2.2)
Albumin: 4.2 g/dL (ref 3.8–4.9)
Alkaline Phosphatase: 98 IU/L (ref 44–121)
BUN/Creatinine Ratio: 19 (ref 9–23)
BUN: 15 mg/dL (ref 6–24)
Bilirubin Total: 0.8 mg/dL (ref 0.0–1.2)
CO2: 25 mmol/L (ref 20–29)
Calcium: 9.6 mg/dL (ref 8.7–10.2)
Chloride: 102 mmol/L (ref 96–106)
Creatinine, Ser: 0.77 mg/dL (ref 0.57–1.00)
Globulin, Total: 2.1 g/dL (ref 1.5–4.5)
Glucose: 87 mg/dL (ref 65–99)
Potassium: 4.4 mmol/L (ref 3.5–5.2)
Sodium: 141 mmol/L (ref 134–144)
Total Protein: 6.3 g/dL (ref 6.0–8.5)
eGFR: 92 mL/min/{1.73_m2} (ref >59)

## 2020-12-10 LAB — CBC WITH DIFFERENTIAL/PLATELET
Basophils Absolute: 0.1 10*3/uL (ref 0.0–0.2)
Basos: 1 %
EOS (ABSOLUTE): 0.3 10*3/uL (ref 0.0–0.4)
Eos: 3 %
Hematocrit: 44.8 % (ref 34.0–46.6)
Hemoglobin: 15.1 g/dL (ref 11.1–15.9)
Immature Grans (Abs): 0 10*3/uL (ref 0.0–0.1)
Immature Granulocytes: 1 %
Lymphocytes Absolute: 2.7 10*3/uL (ref 0.7–3.1)
Lymphs: 32 %
MCH: 31.4 pg (ref 26.6–33.0)
MCHC: 33.7 g/dL (ref 31.5–35.7)
MCV: 93 fL (ref 79–97)
Monocytes Absolute: 0.6 10*3/uL (ref 0.1–0.9)
Monocytes: 7 %
Neutrophils Absolute: 4.6 10*3/uL (ref 1.4–7.0)
Neutrophils: 56 %
Platelets: 205 10*3/uL (ref 150–450)
RBC: 4.81 x10E6/uL (ref 3.77–5.28)
RDW: 11.8 % (ref 11.7–15.4)
WBC: 8.2 10*3/uL (ref 3.4–10.8)

## 2020-12-10 LAB — GLIA (IGA/G) + TTG IGA
Antigliadin Abs, IgA: 5 units (ref 0–19)
Gliadin IgG: 1 units (ref 0–19)
Transglutaminase IgA: <2 U/mL (ref 0–3)

## 2020-12-10 LAB — ENDOMYSIAL IGA ANTIBODY: Endomysial IgA: NEGATIVE

## 2021-01-01 ENCOUNTER — Telehealth: Payer: Self-pay | Admitting: Nurse Practitioner

## 2021-01-01 NOTE — Telephone Encounter (Signed)
Pt aware all labs are perfectly normal. She is still having the loose stools and never ever has a real hard formed stool. What is the next step to figuring out what is wrong

## 2021-01-01 NOTE — Telephone Encounter (Signed)
Pt would like for a nurse to call and explain her lab results in detail

## 2021-01-02 NOTE — Telephone Encounter (Signed)
LMTCB

## 2021-01-24 NOTE — Telephone Encounter (Signed)
LMTCB, mailed pt the comments from [facility]

## 2021-03-18 ENCOUNTER — Other Ambulatory Visit: Payer: Self-pay | Admitting: *Deleted

## 2021-03-18 DIAGNOSIS — F411 Generalized anxiety disorder: Secondary | ICD-10-CM

## 2021-03-18 DIAGNOSIS — F321 Major depressive disorder, single episode, moderate: Secondary | ICD-10-CM

## 2021-03-18 MED ORDER — ESCITALOPRAM OXALATE 10 MG PO TABS
10.0000 mg | ORAL_TABLET | Freq: Every day | ORAL | 0 refills | Status: DC
Start: 2021-03-18 — End: 2022-05-14

## 2022-02-14 ENCOUNTER — Encounter: Payer: Managed Care, Other (non HMO) | Admitting: Nurse Practitioner

## 2022-05-14 ENCOUNTER — Encounter: Payer: Self-pay | Admitting: Nurse Practitioner

## 2022-05-14 ENCOUNTER — Ambulatory Visit (INDEPENDENT_AMBULATORY_CARE_PROVIDER_SITE_OTHER): Payer: 59 | Admitting: Nurse Practitioner

## 2022-05-14 VITALS — BP 139/95 | HR 85 | Temp 98.7°F | Ht 64.0 in | Wt 216.0 lb

## 2022-05-14 DIAGNOSIS — B49 Unspecified mycosis: Secondary | ICD-10-CM

## 2022-05-14 MED ORDER — TERBINAFINE HCL 1 % EX CREA: 1.0000 | TOPICAL_CREAM | Freq: Two times a day (BID) | CUTANEOUS | 0 refills | Status: DC

## 2022-05-14 NOTE — Patient Instructions (Signed)
Athlete's Foot  Athlete's foot (tinea pedis) is a fungal infection of the skin on your feet. It often occurs on the skin that is between or underneath your toes. It can also occur on the soles of your feet. Symptoms include itchy or white and flaky areas on the skin. The infection can spread from person to person (is contagious). It can also spread when a person's bare feet come in contact with the fungus on shower floors or on items such as shoes. Follow these instructions at home: Medicines Apply or take over-the-counter and prescription medicines only as told by your doctor. Apply your antifungal medicine as told by your doctor. Do not stop using it even if your feet start to get better. Foot care Do not scratch your feet. Keep your feet dry: Wear cotton or wool socks. Change your socks every day or if they become wet. Wear shoes that allow air to move around, such as sandals or canvas tennis shoes. Wash and dry your feet: Every day or as told by your doctor. After exercising. Including the area between your toes. General instructions Do not share any of these items that touch your feet: Towels. Shoes. Nail clippers. Other personal items. Protect your feet by wearing sandals in wet areas, such as locker rooms and shared showers. Keep all follow-up visits. If you have diabetes, keep your blood sugar under control. Contact a doctor if: You have a fever. You have swelling, pain, warmth, or redness in your foot. Your feet are not getting better with treatment. Your symptoms get worse. You have new symptoms. You have very bad pain. Summary Athlete's foot is a fungal infection of the skin on your feet. This condition is caused by a fungus that grows in warm, moist places. Symptoms include itchy or white and flaky areas on the skin. Apply your antifungal medicine as told by your doctor. Keep your feet clean and dry. This information is not intended to replace advice given to you by  your health care provider. Make sure you discuss any questions you have with your health care provider. Document Revised: 11/25/2020 Document Reviewed: 11/25/2020 Elsevier Patient Education  2023 Elsevier Inc.  

## 2022-05-14 NOTE — Progress Notes (Signed)
   Acute Office Visit  Subjective:     Patient ID: Leslie Abbott, female    DOB: 1966-05-31, 56 y.o.   MRN: [id number]  Chief Complaint  Patient presents with   Rash    Left foot - rash between toes     Rash This is a new problem. The current episode started in the past 7 days. The problem is unchanged. The affected locations include the left foot. The rash is characterized by itchiness and draining. She was exposed to nothing. Pertinent negatives include no fever or joint pain. Past treatments include nothing.     Review of Systems  Constitutional: Negative.  Negative for fever.  HENT: Negative.    Respiratory: Negative.    Cardiovascular: Negative.   Musculoskeletal:  Negative for joint pain.  Skin:  Positive for rash.  All other systems reviewed and are negative.       Objective:    BP (!) 139/95   Pulse 85   Temp 98.7 F (37.1 C)   Ht 5\' 4"  (1.626 m)   Wt 216 lb (98 kg)   SpO2 98%   BMI 37.08 kg/m  BP Readings from Last 3 Encounters:  05/14/22 (!) 139/95  11/28/20 119/82  10/08/20 135/81      Physical Exam Vitals and nursing note reviewed.  HENT:     Head: Normocephalic.     Right Ear: External ear normal.     Left Ear: External ear normal.     Nose: Nose normal.  Eyes:     Conjunctiva/sclera: Conjunctivae normal.  Cardiovascular:     Rate and Rhythm: Normal rate and regular rhythm.     Pulses: Normal pulses.     Heart sounds: Normal heart sounds.  Pulmonary:     Breath sounds: Normal breath sounds.  Skin:    General: Skin is warm.     Findings: Rash present.     Nails: There is no clubbing.     Comments: Small cluster rash in between great toe and middle finger  Neurological:     Mental Status: She is oriented to person, place, and time.  Psychiatric:        Mood and Affect: Mood normal.        Behavior: Behavior normal.     No results found for any visits on 05/14/22.      Assessment & Plan:  Patient present with athletes foot with  small rash in between left great toe and first toe.  Avoid moisture, wash all socks in hot water, keep feet clean and dry. Apply Lamisil twice daily for 4-6 weeks and follow up with unresolved symptoms.  Patient verbalize understanding   Problem List Items Addressed This Visit   None Visit Diagnoses     Fungus infection    -  Primary   Relevant Medications   terbinafine (LAMISIL AT) 1 % cream       Meds ordered this encounter  Medications   terbinafine (LAMISIL AT) 1 % cream    Sig: Apply 1 Application topically 2 (two) times daily.    Dispense:  30 g    Refill:  0    Order Specific Question:   Supervising Provider    Answer:   Claretta Fraise [[id number]]    Return if symptoms worsen or fail to improve.  Ivy Lynn, NP

## 2022-07-04 ENCOUNTER — Encounter: Payer: Self-pay | Admitting: Family Medicine

## 2022-07-04 ENCOUNTER — Ambulatory Visit (INDEPENDENT_AMBULATORY_CARE_PROVIDER_SITE_OTHER): Payer: 59 | Admitting: Family Medicine

## 2022-07-04 DIAGNOSIS — N3001 Acute cystitis with hematuria: Secondary | ICD-10-CM

## 2022-07-04 DIAGNOSIS — R82998 Other abnormal findings in urine: Secondary | ICD-10-CM | POA: Diagnosis not present

## 2022-07-04 LAB — URINALYSIS, ROUTINE W REFLEX MICROSCOPIC
Bilirubin, UA: NEGATIVE
Glucose, UA: NEGATIVE
Ketones, UA: NEGATIVE
Leukocytes,UA: NEGATIVE
Nitrite, UA: NEGATIVE
Protein,UA: NEGATIVE
Specific Gravity, UA: <1.005 — ABNORMAL LOW (ref 1.005–1.030)
Urobilinogen, Ur: 0.2 mg/dL (ref 0.2–1.0)
pH, UA: 5.5 (ref 5.0–7.5)

## 2022-07-04 LAB — MICROSCOPIC EXAMINATION: Renal Epithel, UA: NONE SEEN /hpf

## 2022-07-04 MED ORDER — SULFAMETHOXAZOLE-TRIMETHOPRIM 800-160 MG PO TABS: 1.0000 | ORAL_TABLET | Freq: Two times a day (BID) | ORAL | 0 refills | Status: AC

## 2022-07-04 NOTE — Progress Notes (Signed)
Telephone visit  Subjective: Leslie Abbott in urin PCP: Leslie Lynn, NP ZUA:Leslie Abbott is a 56 y.o. female calls for telephone consult today. Patient provides verbal consent for consult held via phone.  Due to COVID-19 pandemic this visit was conducted virtually. This visit type was conducted due to national recommendations for restrictions regarding the COVID-19 Pandemic (e.g. social distancing, sheltering in place) in an effort to limit this patient's exposure and mitigate transmission in our community. All issues noted in this document were discussed and addressed.  A physical exam was not performed with this format.   Location of patient: home Location of provider: [facility] Others present for call: none  1. Blood urine Patient reports that she woke up the other am and saw dark urine with possible blood.  She was not drinking water but is now. No dysuria, no frequency, no nausea/ vomiting or fevers.  She just got over a diarrheal virus.  No reports of flank pain.   ROS: Per HPI  No Known Allergies Past Medical History:  Diagnosis Date   Anxiety    Depression     Current Outpatient Medications:    terbinafine (LAMISIL AT) 1 % cream, Apply 1 Application topically 2 (two) times daily., Disp: 30 g, Rfl: 0  Assessment/ Plan: 56 y.o. female   Acute cystitis with hematuria - Plan: Urinalysis, Routine w reflex microscopic, Urine Culture, sulfamethoxazole-trimethoprim (BACTRIM DS) 800-160 MG tablet  Dark urine - Plan: CMP14+EGFR  Possible UTI but suspect largely due to dehydration in the setting of recent diarrheal illness.  Besides dark possibly bloody urine she really demonstrates no other signs or symptoms to suggest UTI.  Will send for urine culture.  Check renal function.  Discussed adequate hydration needed.  She will come in for UA, CMP  Start time: 9:17a; returned call at 12:31a End time: 9:21a; results given and ended at 12:33a  Total time spent on patient care (including  telephone call/ virtual visit): 6 minutes  [place], [place] [phone]

## 2022-07-05 LAB — CMP14+EGFR
ALT: 28 IU/L (ref 0–32)
AST: 22 IU/L (ref 0–40)
Albumin/Globulin Ratio: 2.4 — ABNORMAL HIGH (ref 1.2–2.2)
Albumin: 4.6 g/dL (ref 3.8–4.9)
Alkaline Phosphatase: 104 IU/L (ref 44–121)
BUN/Creatinine Ratio: 22 (ref 9–23)
BUN: 16 mg/dL (ref 6–24)
Bilirubin Total: 0.5 mg/dL (ref 0.0–1.2)
CO2: 26 mmol/L (ref 20–29)
Calcium: 9.4 mg/dL (ref 8.7–10.2)
Chloride: 98 mmol/L (ref 96–106)
Creatinine, Ser: 0.72 mg/dL (ref 0.57–1.00)
Globulin, Total: 1.9 g/dL (ref 1.5–4.5)
Glucose: 103 mg/dL — ABNORMAL HIGH (ref 70–99)
Potassium: 4.2 mmol/L (ref 3.5–5.2)
Sodium: 139 mmol/L (ref 134–144)
Total Protein: 6.5 g/dL (ref 6.0–8.5)
eGFR: 98 mL/min/{1.73_m2} (ref >59)

## 2022-07-15 LAB — URINE CULTURE

## 2022-10-10 ENCOUNTER — Encounter: Payer: Self-pay | Admitting: Family Medicine

## 2022-10-10 ENCOUNTER — Ambulatory Visit (INDEPENDENT_AMBULATORY_CARE_PROVIDER_SITE_OTHER): Payer: 59 | Admitting: Family Medicine

## 2022-10-10 VITALS — BP 134/82 | HR 88 | Temp 97.1°F | Resp 20 | Ht 64.0 in | Wt 216.0 lb

## 2022-10-10 DIAGNOSIS — F339 Major depressive disorder, recurrent, unspecified: Secondary | ICD-10-CM

## 2022-10-10 DIAGNOSIS — F411 Generalized anxiety disorder: Secondary | ICD-10-CM | POA: Diagnosis not present

## 2022-10-10 MED ORDER — ESCITALOPRAM OXALATE 10 MG PO TABS: 10.0000 mg | ORAL_TABLET | Freq: Every day | ORAL | 1 refills | Status: DC

## 2022-10-10 MED ORDER — BUSPIRONE HCL 5 MG PO TABS: 5.0000 mg | ORAL_TABLET | Freq: Two times a day (BID) | ORAL | 0 refills | Status: DC

## 2022-10-10 NOTE — Progress Notes (Signed)
Established Patient Office Visit  Subjective   Patient ID: Leslie Abbott, female    DOB: 07/11/1966  Age: 57 y.o. MRN: [id number]  Chief Complaint  Patient presents with   Depression    Depression        Leslie Abbott is here for anxiety and depression. She reports a hx of both but that it had been well controlled without medication. She has had increased stress and now feels like her symptoms aren't really manageable. She previously did well with lexapro and buspar.       10/10/2022    1:12 PM 11/28/2020    2:49 PM 08/27/2020    9:36 AM  Depression screen PHQ 2/9  Decreased Interest 2 0 2  Down, Depressed, Hopeless 2 0 1  PHQ - 2 Score 4 0 3  Altered sleeping 3 0 0  Tired, decreased energy 3 0 3  Change in appetite 2 0 2  Feeling bad or failure about yourself  2 0 1  Trouble concentrating 2 0 2  Moving slowly or fidgety/restless 0 0 2  Suicidal thoughts 0 0 0  PHQ-9 Score 16 0 13  Difficult doing work/chores Somewhat difficult        10/10/2022    1:13 PM 08/27/2020    9:36 AM 03/23/2019    8:41 PM 11/27/2015   10:51 AM  GAD 7 : Generalized Anxiety Score  Nervous, Anxious, on Edge '3 3 1 3  '$ Control/stop worrying '3 3 1 3  '$ Worry too much - different things '3 3 1 3  '$ Trouble relaxing 3 3 0 3  Restless 3 2 0 3  Easily annoyed or irritable '2 3 1 3  '$ Afraid - awful might happen '2 2 1 1  '$ Total GAD 7 Score '19 19 5 19  '$ Anxiety Difficulty Somewhat difficult   Very difficult       Review of Systems  Psychiatric/Behavioral:  Positive for depression.    As per HPI.   Objective:     BP 134/82   Pulse 88   Temp (!) 97.1 F (36.2 C) (Oral)   Resp 20   Ht '5\' 4"'$  (1.626 m)   Wt 216 lb (98 kg)   SpO2 96%   BMI 37.08 kg/m    Physical Exam Vitals and nursing note reviewed.  Constitutional:      General: She is not in acute distress.    Appearance: She is not ill-appearing, toxic-appearing or diaphoretic.  Cardiovascular:     Rate and Rhythm: Normal rate and regular rhythm.      Heart sounds: Normal heart sounds. No murmur heard. Pulmonary:     Effort: Pulmonary effort is normal.     Breath sounds: Normal breath sounds.  Abdominal:     General: Bowel sounds are normal. There is no distension.     Palpations: Abdomen is soft.     Tenderness: There is no abdominal tenderness. There is no guarding or rebound.  Musculoskeletal:     Right lower leg: No edema.     Left lower leg: No edema.  Skin:    General: Skin is warm and dry.  Neurological:     General: No focal deficit present.     Mental Status: She is alert and oriented to person, place, and time.  Psychiatric:        Mood and Affect: Mood normal.        Behavior: Behavior normal.    No results found for any visits on 10/10/22.  The 10-year ASCVD risk score (Arnett DK, et al., 2019) is: 3.7%    Assessment & Plan:   Lavana was seen today for depression.  Diagnoses and all orders for this visit:  Generalized anxiety disorder Depression, recurrent ([place]) Uncontrolled. Denies SI. Start lexapro. Will also start buspar for quicker relief of anxiety. Follow up in 6 weeks, sooner for new or worsening symptoms.  -     escitalopram (LEXAPRO) 10 MG tablet; Take 1 tablet (10 mg total) by mouth daily. -     busPIRone (BUSPAR) 5 MG tablet; Take 1 tablet (5 mg total) by mouth 2 (two) times daily.  Return in about 6 weeks (around 11/21/2022) for medication follow up.   The patient indicates understanding of these issues and agrees with the plan.  Gwenlyn Perking, FNP

## 2022-11-26 ENCOUNTER — Ambulatory Visit (INDEPENDENT_AMBULATORY_CARE_PROVIDER_SITE_OTHER): Payer: 59 | Admitting: Family Medicine

## 2022-11-26 ENCOUNTER — Encounter: Payer: Self-pay | Admitting: Family Medicine

## 2022-11-26 VITALS — BP 130/82 | HR 74 | Temp 97.6°F | Ht 64.0 in | Wt 217.5 lb

## 2022-11-26 DIAGNOSIS — M545 Low back pain, unspecified: Secondary | ICD-10-CM | POA: Diagnosis not present

## 2022-11-26 DIAGNOSIS — F411 Generalized anxiety disorder: Secondary | ICD-10-CM

## 2022-11-26 DIAGNOSIS — F339 Major depressive disorder, recurrent, unspecified: Secondary | ICD-10-CM

## 2022-11-26 MED ORDER — TIZANIDINE HCL 4 MG PO TABS: 4.0000 mg | ORAL_TABLET | Freq: Four times a day (QID) | ORAL | 0 refills | Status: DC | PRN

## 2022-11-26 MED ORDER — ESCITALOPRAM OXALATE 20 MG PO TABS: 20.0000 mg | ORAL_TABLET | Freq: Every day | ORAL | 1 refills | Status: DC

## 2022-11-26 NOTE — Progress Notes (Signed)
Established Patient Office Visit  Subjective   Patient ID: Leslie Abbott, female    DOB: 1966-03-06  Age: 57 y.o. MRN: [id number]  Chief Complaint  Patient presents with   Depression   Anxiety    HPI Berkli is here for follow up of anxiety and depression. She restarted on lexapro and buspar 6 weeks ago. She reports that his hs been somewhat helpful but that her symptoms are still uncontrolled.   Back Pain: Patient presents for presents evaluation of low back problems.  Symptoms have been present for 1 month and include pain in lower back (aching in character; 6/10 in severity). Initial inciting event: none.  Alleviating factors identifiable by patient are recumbency and NSAIDs. Exacerbating factors identifiable by patient are bending forwards, bending sideways, and standing for long periods . Treatments so far initiated by patient:  NAIDs OTC  Previous lower back problems: none. Previous workup: none. She does a lot of bending and lifting at her job. Denies injury, numbness, tingling, pain in her legs, saddle anesthesia, changes in bowel or bladder control, fever, or weight loss.       11/26/2022    3:00 PM 10/10/2022    1:12 PM 11/28/2020    2:49 PM  Depression screen PHQ 2/9  Decreased Interest 2 2 0  Down, Depressed, Hopeless 2 2 0  PHQ - 2 Score 4 4 0  Altered sleeping 1 3 0  Tired, decreased energy 3 3 0  Change in appetite 3 2 0  Feeling bad or failure about yourself  2 2 0  Trouble concentrating 2 2 0  Moving slowly or fidgety/restless 1 0 0  Suicidal thoughts 0 0 0  PHQ-9 Score 16 16 0  Difficult doing work/chores Somewhat difficult Somewhat difficult       11/26/2022    3:02 PM 10/10/2022    1:13 PM 08/27/2020    9:36 AM 03/23/2019    8:41 PM  GAD 7 : Generalized Anxiety Score  Nervous, Anxious, on Edge 2 3 3 1   Control/stop worrying 3 3 3 1   Worry too much - different things 2 3 3 1   Trouble relaxing 2 3 3  0  Restless 2 3 2  0  Easily annoyed or irritable 2 2 3 1    Afraid - awful might happen 2 2 2 1   Total GAD 7 Score 15 19 19 5   Anxiety Difficulty Somewhat difficult Somewhat difficult         ROS As per HPI.    Objective:     BP 130/82   Pulse 74   Temp 97.6 F (36.4 C) (Temporal)   Ht 5\' 4"  (1.626 m)   Wt 217 lb 8 oz (98.7 kg)   SpO2 96%   BMI 37.33 kg/m    Physical Exam Vitals and nursing note reviewed.  Constitutional:      General: She is not in acute distress.    Appearance: She is not ill-appearing, toxic-appearing or diaphoretic.  Cardiovascular:     Rate and Rhythm: Regular rhythm.     Heart sounds: Normal heart sounds. No murmur heard. Pulmonary:     Effort: Pulmonary effort is normal.     Breath sounds: Normal breath sounds.  Musculoskeletal:     Thoracic back: Normal.     Lumbar back: Normal.     Right lower leg: No edema.     Left lower leg: No edema.  Skin:    General: Skin is warm and dry.  Neurological:  General: No focal deficit present.     Mental Status: She is alert and oriented to person, place, and time.     Gait: Gait normal.  Psychiatric:        Mood and Affect: Mood normal.        Behavior: Behavior normal.        Thought Content: Thought content normal.        Judgment: Judgment normal.      No results found for any visits on 11/26/22.    The 10-year ASCVD risk score (Arnett DK, et al., 2019) is: 3.5%    Assessment & Plan:   Layce was seen today for depression and anxiety.  Diagnoses and all orders for this visit:  Depression, recurrent Generalized anxiety disorder Improving, but not well controlled. Denies SI. Increase lexapro as below. Continue buspar.  -     escitalopram (LEXAPRO) 20 MG tablet; Take 1 tablet (20 mg total) by mouth daily.  Acute left-sided low back pain without sciatica Discussed strain. No red flags. Benign exam. Continue NSAIDs prn. Discussed heat, stretching. Can try tizanidine prn for spasms. Discussed xray if no improvement.  -     tiZANidine  (ZANAFLEX) 4 MG tablet; Take 1 tablet (4 mg total) by mouth every 6 (six) hours as needed for muscle spasms.   Return in about 6 weeks (around 01/07/2023) for medication follow up.   The patient indicates understanding of these issues and agrees with the plan.  Gabriel Earing, FNP

## 2022-11-26 NOTE — Patient Instructions (Signed)

## 2023-01-16 ENCOUNTER — Encounter: Payer: Self-pay | Admitting: Family Medicine

## 2023-01-16 ENCOUNTER — Ambulatory Visit (INDEPENDENT_AMBULATORY_CARE_PROVIDER_SITE_OTHER): Payer: 59 | Admitting: Family Medicine

## 2023-01-16 VITALS — BP 135/88 | HR 74 | Temp 97.7°F | Ht 64.0 in | Wt 216.0 lb

## 2023-01-16 DIAGNOSIS — F411 Generalized anxiety disorder: Secondary | ICD-10-CM

## 2023-01-16 DIAGNOSIS — F339 Major depressive disorder, recurrent, unspecified: Secondary | ICD-10-CM | POA: Diagnosis not present

## 2023-01-16 MED ORDER — BUSPIRONE HCL 5 MG PO TABS: 5.0000 mg | ORAL_TABLET | Freq: Two times a day (BID) | ORAL | 3 refills | Status: AC

## 2023-01-16 NOTE — Progress Notes (Signed)
Established Patient Office Visit  Subjective   Patient ID: Leslie Abbott, female    DOB: 1965-11-24  Age: 57 y.o. MRN: [id number]  Chief Complaint  Patient presents with   Depression    Depression        Leslie Abbott is here for a follow up of anxiety and depression. She has noticed an improvement with the higher dosage of lexapro. She continues of buspar as well. She feels like her symptoms are currently well managed and she does not desire a change in her regime at this time. Denies side effects.      01/16/2023    1:03 PM 11/26/2022    3:00 PM 10/10/2022    1:12 PM  Depression screen PHQ 2/9  Decreased Interest 2 2 2   Down, Depressed, Hopeless 2 2 2   PHQ - 2 Score 4 4 4   Altered sleeping 1 1 3   Tired, decreased energy 3 3 3   Change in appetite 2 3 2   Feeling bad or failure about yourself  1 2 2   Trouble concentrating 1 2 2   Moving slowly or fidgety/restless 0 1 0  Suicidal thoughts 0 0 0  PHQ-9 Score 12 16 16   Difficult doing work/chores Somewhat difficult Somewhat difficult Somewhat difficult      01/16/2023    1:03 PM 11/26/2022    3:02 PM 10/10/2022    1:13 PM 08/27/2020    9:36 AM  GAD 7 : Generalized Anxiety Score  Nervous, Anxious, on Edge 0 2 3 3   Control/stop worrying 0 3 3 3   Worry too much - different things 1 2 3 3   Trouble relaxing 1 2 3 3   Restless 0 2 3 2   Easily annoyed or irritable 0 2 2 3   Afraid - awful might happen 1 2 2 2   Total GAD 7 Score 3 15 19 19   Anxiety Difficulty Somewhat difficult Somewhat difficult Somewhat difficult         Review of Systems  Psychiatric/Behavioral:  Positive for depression.    As per HPI.    Objective:     BP 135/88   Pulse 74   Temp 97.7 F (36.5 C) (Temporal)   Ht 5\' 4"  (1.626 m)   Wt 216 lb (98 kg)   SpO2 95%   BMI 37.08 kg/m    Physical Exam Vitals and nursing note reviewed.  Constitutional:      General: She is not in acute distress.    Appearance: She is not ill-appearing, toxic-appearing or  diaphoretic.  Cardiovascular:     Rate and Rhythm: Normal rate and regular rhythm.     Heart sounds: Normal heart sounds. No murmur heard. Pulmonary:     Effort: Pulmonary effort is normal. No respiratory distress.     Breath sounds: Normal breath sounds.  Musculoskeletal:     Right lower leg: No edema.     Left lower leg: No edema.  Skin:    General: Skin is warm and dry.  Neurological:     General: No focal deficit present.     Mental Status: She is alert and oriented to person, place, and time.  Psychiatric:        Mood and Affect: Mood normal.        Thought Content: Thought content normal.        Judgment: Judgment normal.      No results found for any visits on 01/16/23.    The 10-year ASCVD risk score (Arnett DK, et al., 2019)  is: 3.7%    Assessment & Plan:   Leslie Abbott was seen today for depression.  Diagnoses and all orders for this visit:  Depression, recurrent ([facility]) Generalized anxiety disorder Well controlled on current regimen. Continue lexparo and buspar.  -     busPIRone (BUSPAR) 5 MG tablet; Take 1 tablet (5 mg total) by mouth 2 (two) times daily.    Return in about 3 months (around 04/18/2023) for CPE.   The patient indicates understanding of these issues and agrees with the plan.  Gabriel Earing, FNP

## 2023-05-08 ENCOUNTER — Encounter: Payer: Self-pay | Admitting: Family Medicine

## 2023-06-08 ENCOUNTER — Encounter: Payer: Self-pay | Admitting: Family Medicine

## 2023-10-01 ENCOUNTER — Telehealth: Payer: Self-pay | Admitting: Family Medicine

## 2023-10-01 ENCOUNTER — Other Ambulatory Visit: Payer: Self-pay | Admitting: Family Medicine

## 2023-10-01 DIAGNOSIS — F411 Generalized anxiety disorder: Secondary | ICD-10-CM

## 2023-10-01 DIAGNOSIS — F339 Major depressive disorder, recurrent, unspecified: Secondary | ICD-10-CM

## 2023-10-01 MED ORDER — ESCITALOPRAM OXALATE 20 MG PO TABS: 20.0000 mg | ORAL_TABLET | Freq: Every day | ORAL | 1 refills | Status: DC

## 2023-10-01 NOTE — Telephone Encounter (Signed)
Copied from CRM [id number]. Topic: Clinical - Request for Lab/Test Order >> Oct 01, 2023 10:18 AM Elle L wrote: Reason for CRM: The patient is requesting lab orders and to have a lab appointment scheduled  before her appointment but on the same day on 2/17. Her call back number is [phone].

## 2023-10-01 NOTE — Telephone Encounter (Signed)
Pt states she just wanted to make sure she would be able to get labs at her appt. Pt doesn't need any further assistance.

## 2023-10-01 NOTE — Telephone Encounter (Signed)
Copied from CRM [id number]. Topic: Clinical - Medication Refill >> Oct 01, 2023 10:16 AM Elle L wrote: Most Recent Primary Care Visit:  Provider: Gabriel Earing  Department: [facility]  Visit Type: OFFICE VISIT  Date: 01/16/2023  Medication: escitalopram (LEXAPRO) 20 MG tablet  Has the patient contacted their pharmacy? Yes  Is this the correct pharmacy for this prescription? Yes If no, delete pharmacy and type the correct one.  This is the patient's preferred pharmacy:  [facility] [address], [place] - [address] [place] [address] [place] [address] Phone: [phone] Fax: [phone]   Has the prescription been filled recently? No  Is the patient out of the medication? No  Has the patient been seen for an appointment in the last year OR does the patient have an upcoming appointment? Yes  Can we respond through MyChart? Yes  Agent: Please be advised that Rx refills may take up to 3 business days. We ask that you follow-up with your pharmacy.

## 2023-10-05 ENCOUNTER — Ambulatory Visit (INDEPENDENT_AMBULATORY_CARE_PROVIDER_SITE_OTHER): Payer: 59 | Admitting: Family Medicine

## 2023-10-05 ENCOUNTER — Encounter: Payer: Self-pay | Admitting: Family Medicine

## 2023-10-05 ENCOUNTER — Encounter: Payer: Self-pay | Admitting: Emergency Medicine

## 2023-10-05 VITALS — BP 132/86 | HR 76 | Temp 98.1°F | Ht 64.0 in | Wt 217.8 lb

## 2023-10-05 DIAGNOSIS — Z1159 Encounter for screening for other viral diseases: Secondary | ICD-10-CM

## 2023-10-05 DIAGNOSIS — F411 Generalized anxiety disorder: Secondary | ICD-10-CM | POA: Diagnosis not present

## 2023-10-05 DIAGNOSIS — Z0000 Encounter for general adult medical examination without abnormal findings: Secondary | ICD-10-CM

## 2023-10-05 DIAGNOSIS — G8929 Other chronic pain: Secondary | ICD-10-CM

## 2023-10-05 DIAGNOSIS — Z0001 Encounter for general adult medical examination with abnormal findings: Secondary | ICD-10-CM | POA: Diagnosis not present

## 2023-10-05 DIAGNOSIS — E782 Mixed hyperlipidemia: Secondary | ICD-10-CM

## 2023-10-05 DIAGNOSIS — M545 Low back pain, unspecified: Secondary | ICD-10-CM

## 2023-10-05 DIAGNOSIS — E559 Vitamin D deficiency, unspecified: Secondary | ICD-10-CM

## 2023-10-05 DIAGNOSIS — F17200 Nicotine dependence, unspecified, uncomplicated: Secondary | ICD-10-CM

## 2023-10-05 DIAGNOSIS — F339 Major depressive disorder, recurrent, unspecified: Secondary | ICD-10-CM | POA: Diagnosis not present

## 2023-10-05 DIAGNOSIS — Z114 Encounter for screening for human immunodeficiency virus [HIV]: Secondary | ICD-10-CM

## 2023-10-05 LAB — BAYER DCA HB A1C WAIVED: HB A1C (BAYER DCA - WAIVED): 5.9 % — ABNORMAL HIGH (ref 4.8–5.6)

## 2023-10-05 MED ORDER — ESCITALOPRAM OXALATE 20 MG PO TABS: 20.0000 mg | ORAL_TABLET | Freq: Every day | ORAL | 3 refills | Status: AC

## 2023-10-05 MED ORDER — TIZANIDINE HCL 4 MG PO TABS: 4.0000 mg | ORAL_TABLET | Freq: Four times a day (QID) | ORAL | 0 refills | Status: AC | PRN

## 2023-10-05 NOTE — Patient Instructions (Signed)

## 2023-10-05 NOTE — Progress Notes (Unsigned)
Complete physical exam  Patient: Leslie Abbott   DOB: 02-22-1966   58 y.o. Female  MRN: [id number]  Subjective:    Chief Complaint  Patient presents with   Annual Exam    Leslie Abbott is a 58 y.o. female who presents today for a complete physical exam. She reports consuming a general diet. The patient does not participate in regular exercise at present. She generally feels fairly well. She reports sleeping fairly well. She does not have additional problems to discuss today.   Interested in lung cancer screening. 40 pack years. Current smoker.  Would like refill on muscle relaxer for back pain. Chronic pain, unchanged. No red flag symptoms. Does a lot of lifting at work.    Most recent fall risk assessment:    01/16/2023    1:00 PM  Fall Risk   Falls in the past year? 0     Most recent depression screenings:    01/16/2023    1:03 PM 11/26/2022    3:00 PM  PHQ 2/9 Scores  PHQ - 2 Score 4 4  PHQ- 9 Score 12 16    Vision:Not within last year  and Dental: No current dental problems and Receives regular dental care  Past Medical History:  Diagnosis Date   Anxiety    Depression       Patient Care Team: Leslie Earing, FNP as PCP - General (Family Medicine) [facility]   Outpatient Medications Prior to Visit  Medication Sig   busPIRone (BUSPAR) 5 MG tablet Take 1 tablet (5 mg total) by mouth 2 (two) times daily.   escitalopram (LEXAPRO) 20 MG tablet Take 1 tablet (20 mg total) by mouth daily.   tiZANidine (ZANAFLEX) 4 MG tablet Take 1 tablet (4 mg total) by mouth every 6 (six) hours as needed for muscle spasms.   [DISCONTINUED] Melatonin 10 MG CAPS Take 10 mg by mouth at bedtime.   No facility-administered medications prior to visit.    ROS Negative unless specially indicated above in HPI.      Objective:     BP 132/86   Pulse 76   Temp 98.1 F (36.7 C) (Temporal)   Ht 5\' 4"  (1.626 m)   Wt 217 lb 12.8 oz (98.8 kg)   SpO2 96%   BMI  37.39 kg/m    Physical Exam Vitals and nursing note reviewed.  Constitutional:      General: She is not in acute distress.    Appearance: She is obese. She is not ill-appearing, toxic-appearing or diaphoretic.  HENT:     Head: Normocephalic.     Right Ear: Tympanic membrane, ear canal and external ear normal.     Left Ear: Tympanic membrane, ear canal and external ear normal.     Nose: Nose normal.     Mouth/Throat:     Mouth: Mucous membranes are moist.     Pharynx: Oropharynx is clear.  Eyes:     Extraocular Movements: Extraocular movements intact.     Conjunctiva/sclera: Conjunctivae normal.     Pupils: Pupils are equal, round, and reactive to light.  Cardiovascular:     Rate and Rhythm: Normal rate and regular rhythm.     Pulses: Normal pulses.     Heart sounds: Normal heart sounds. No murmur heard.    No friction rub. No gallop.  Pulmonary:     Effort: Pulmonary effort is normal.     Breath sounds: Normal breath sounds.  Abdominal:  General: Bowel sounds are normal. There is no distension.     Palpations: Abdomen is soft. There is no mass.     Tenderness: There is no abdominal tenderness. There is no guarding.  Musculoskeletal:        General: No swelling or tenderness. Normal range of motion.     Cervical back: Normal range of motion and neck supple. No tenderness.     Right lower leg: No edema.     Left lower leg: No edema.  Skin:    General: Skin is warm and dry.     Capillary Refill: Capillary refill takes less than 2 seconds.     Findings: No lesion or rash.  Neurological:     General: No focal deficit present.     Mental Status: She is alert and oriented to person, place, and time.     Cranial Nerves: No cranial nerve deficit.     Motor: No weakness.     Coordination: Coordination normal.     Gait: Gait normal.     Deep Tendon Reflexes: Reflexes normal.  Psychiatric:        Mood and Affect: Mood normal.        Behavior: Behavior normal.         Thought Content: Thought content normal.        Judgment: Judgment normal.      No results found for any visits on 10/05/23.     Assessment & Plan:    Routine Health Maintenance and Physical Exam  [name] was seen today for annual exam.  Diagnoses and all orders for this visit:  Routine general medical examination at a health care facility  Mixed hyperlipidemia -     Lipid panel  Generalized anxiety disorder Depression, recurrent (HCC) Stable. Denies SI. Continue lexapro.  -     escitalopram (LEXAPRO) 20 MG tablet; Take 1 tablet (20 mg total) by mouth daily.  Morbid obesity (HCC) -     CBC with Differential/Platelet -     CMP14+EGFR -     Lipid panel -     TSH -     Bayer DCA Hb A1c Waived  Vitamin D deficiency -     VITAMIN D 25 Hydroxy (Vit-D Deficiency, Fractures)  Tobacco use disorder -     Ambulatory Referral Lung Cancer Screening [facility]  Need for hepatitis C screening test -     Hepatitis C antibody  Encounter for screening for HIV -     HIV antibody (with reflex)  Chronic bilateral low back pain without sciatica Refill provided. No red flags symptoms. Discussed stretching, proper body mechanics when lifting, back brace.  -     tiZANidine (ZANAFLEX) 4 MG tablet; Take 1 tablet (4 mg total) by mouth every 6 (six) hours as needed for muscle spasms.    Immunization History  Administered Date(s) Administered   Tdap 12/19/1999    Health Maintenance  Topic Date Due   HIV Screening  Never done   Hepatitis C Screening  Never done   Lung Cancer Screening  Never done   Cervical Cancer Screening (HPV/Pap Cotest)  08/09/2018   Fecal DNA (Cologuard)  02/23/2021   INFLUENZA VACCINE  11/16/2023 (Originally 03/19/2023)   COVID-19 Vaccine (1 - 2024-25 season) 10/20/2024 (Originally 04/19/2023)   Zoster Vaccines- Shingrix (1 of 2) 01/01/2025 (Originally 04/08/2016)   MAMMOGRAM  10/28/2024   HPV VACCINES  Aged Out   DTaP/Tdap/Td  Discontinued    Discussed  health benefits of  physical activity, and encouraged her to engage in regular exercise appropriate for her age and condition.  Problem List Items Addressed This Visit       Other   Generalized anxiety disorder   Relevant Medications   escitalopram (LEXAPRO) 20 MG tablet   Vitamin D deficiency   Relevant Orders   VITAMIN D 25 Hydroxy (Vit-D Deficiency, Fractures) (Completed)   Hyperlipidemia   Relevant Orders   Lipid panel (Completed)   Tobacco use disorder   Relevant Orders   Ambulatory Referral Lung Cancer Screening [facility]   Depression, recurrent (HCC)   Relevant Medications   escitalopram (LEXAPRO) 20 MG tablet   Morbid obesity (HCC)   Relevant Orders   CBC with Differential/Platelet (Completed)   CMP14+EGFR (Completed)   Lipid panel (Completed)   TSH (Completed)   Bayer DCA Hb A1c Waived (Completed)   Other Visit Diagnoses       Routine general medical examination at a health care facility    -  Primary     Need for hepatitis C screening test       Relevant Orders   Hepatitis C antibody (Completed)     Encounter for screening for HIV       Relevant Orders   HIV antibody (with reflex) (Completed)     Chronic bilateral low back pain without sciatica       Relevant Medications   escitalopram (LEXAPRO) 20 MG tablet   tiZANidine (ZANAFLEX) 4 MG tablet      Return in about 6 months (around 04/03/2024) for chronic follow up.   The patient indicates understanding of these issues and agrees with the plan.  Leslie Earing, FNP

## 2023-10-06 LAB — CBC WITH DIFFERENTIAL/PLATELET
Basophils Absolute: 0.1 10*3/uL (ref 0.0–0.2)
Basos: 1 %
EOS (ABSOLUTE): 0.3 10*3/uL (ref 0.0–0.4)
Eos: 4 %
Hematocrit: 48.7 % — ABNORMAL HIGH (ref 34.0–46.6)
Hemoglobin: 16.3 g/dL — ABNORMAL HIGH (ref 11.1–15.9)
Immature Grans (Abs): 0 10*3/uL (ref 0.0–0.1)
Immature Granulocytes: 0 %
Lymphocytes Absolute: 2.4 10*3/uL (ref 0.7–3.1)
Lymphs: 34 %
MCH: 31.3 pg (ref 26.6–33.0)
MCHC: 33.5 g/dL (ref 31.5–35.7)
MCV: 94 fL (ref 79–97)
Monocytes Absolute: 0.4 10*3/uL (ref 0.1–0.9)
Monocytes: 6 %
Neutrophils Absolute: 3.8 10*3/uL (ref 1.4–7.0)
Neutrophils: 55 %
Platelets: 236 10*3/uL (ref 150–450)
RBC: 5.2 x10E6/uL (ref 3.77–5.28)
RDW: 11.9 % (ref 11.7–15.4)
WBC: 7 10*3/uL (ref 3.4–10.8)

## 2023-10-06 LAB — CMP14+EGFR
ALT: 17 [IU]/L (ref 0–32)
AST: 14 [IU]/L (ref 0–40)
Albumin: 4.5 g/dL (ref 3.8–4.9)
Alkaline Phosphatase: 114 [IU]/L (ref 44–121)
BUN/Creatinine Ratio: 27 — ABNORMAL HIGH (ref 9–23)
BUN: 21 mg/dL (ref 6–24)
Bilirubin Total: 0.6 mg/dL (ref 0.0–1.2)
CO2: 26 mmol/L (ref 20–29)
Calcium: 9.6 mg/dL (ref 8.7–10.2)
Chloride: 103 mmol/L (ref 96–106)
Creatinine, Ser: 0.78 mg/dL (ref 0.57–1.00)
Globulin, Total: 1.8 g/dL (ref 1.5–4.5)
Glucose: 98 mg/dL (ref 70–99)
Potassium: 4.6 mmol/L (ref 3.5–5.2)
Sodium: 144 mmol/L (ref 134–144)
Total Protein: 6.3 g/dL (ref 6.0–8.5)
eGFR: 89 mL/min/{1.73_m2} (ref >59)

## 2023-10-06 LAB — HIV ANTIBODY (ROUTINE TESTING W REFLEX): HIV Screen 4th Generation wRfx: NONREACTIVE

## 2023-10-06 LAB — HEPATITIS C ANTIBODY: Hep C Virus Ab: NONREACTIVE

## 2023-10-06 LAB — LIPID PANEL
Chol/HDL Ratio: 6.1 {ratio} — ABNORMAL HIGH (ref 0.0–4.4)
Cholesterol, Total: 252 mg/dL — ABNORMAL HIGH (ref 100–199)
HDL: 41 mg/dL (ref >39)
LDL Chol Calc (NIH): 152 mg/dL — ABNORMAL HIGH (ref 0–99)
Triglycerides: 317 mg/dL — ABNORMAL HIGH (ref 0–149)
VLDL Cholesterol Cal: 59 mg/dL — ABNORMAL HIGH (ref 5–40)

## 2023-10-06 LAB — VITAMIN D 25 HYDROXY (VIT D DEFICIENCY, FRACTURES): Vit D, 25-Hydroxy: 14.6 ng/mL — ABNORMAL LOW (ref 30.0–100.0)

## 2023-10-06 LAB — TSH: TSH: 1.61 u[IU]/mL (ref 0.450–4.500)

## 2023-10-07 ENCOUNTER — Other Ambulatory Visit: Payer: Self-pay | Admitting: Family Medicine

## 2023-10-07 DIAGNOSIS — E559 Vitamin D deficiency, unspecified: Secondary | ICD-10-CM

## 2023-10-07 MED ORDER — VITAMIN D (ERGOCALCIFEROL) 1.25 MG (50000 UNIT) PO CAPS: 50000.0000 [IU] | ORAL_CAPSULE | ORAL | 0 refills | Status: AC

## 2023-10-19 ENCOUNTER — Ambulatory Visit: Payer: Self-pay | Admitting: Family Medicine

## 2023-10-19 NOTE — Telephone Encounter (Signed)
 Patient called with questions about causes of her lab work. Utilizing notations from provider, all patient's questions were answered.   Copied from CRM [id number]. Topic: Clinical - Lab/Test Results >> Oct 19, 2023 11:16 AM Fuller Mandril wrote: Reason for CRM: Patient called, would like someone to contact her regarding results. Attempted to read note as written by provider. Patient has further questions about causes and treatment. Thank You Reason for Disposition  [1] Other NON-URGENT information for PCP AND [2] does not require PCP response  Answer Assessment - Initial Assessment Questions 1. REASON FOR CALL or QUESTION: "What is your reason for calling today?" or "How can I best help you?" or "What question do you have that I can help answer?"     Patient called with questions about her lab work from office. Reviewed results with patient and answered questions. Information from provider was used to answer all questions. 2. CALLER: Document the source of call. (e.g., laboratory, patient).     patient  Protocols used: PCP Call - No Triage-A-AH

## 2023-11-12 ENCOUNTER — Other Ambulatory Visit: Payer: Self-pay | Admitting: *Deleted

## 2023-11-12 ENCOUNTER — Telehealth: Payer: Self-pay | Admitting: *Deleted

## 2023-11-12 DIAGNOSIS — Z122 Encounter for screening for malignant neoplasm of respiratory organs: Secondary | ICD-10-CM

## 2023-11-12 DIAGNOSIS — F17210 Nicotine dependence, cigarettes, uncomplicated: Secondary | ICD-10-CM

## 2023-11-12 DIAGNOSIS — Z87891 Personal history of nicotine dependence: Secondary | ICD-10-CM

## 2023-11-12 NOTE — Telephone Encounter (Signed)
 Lung Cancer Screening Narrative/Criteria Questionnaire (Cigarette Smokers Only- No Cigars/Pipes/vapes)   Leslie Abbott   SDMV:12/14/23 9:30- [place]                                           Mar 27, 1966              LDCT: 12/14/23 1:30- AP    57 y.o.   Phone: [phone]  Lung Screening Narrative (confirm age 25-77 yrs Medicare / 50-80 yrs Private pay insurance)   Insurance information:[employer]   Referring Provider:Morgan   This screening involves an initial phone call with a team member from our program. It is called a shared decision making visit. The initial meeting is required by insurance and Medicare to make sure you understand the program. This appointment takes about 15-20 minutes to complete. The CT scan will completed at a separate date/time. This scan takes about 5-10 minutes to complete and you may eat and drink before and after the scan.  Criteria questions for Lung Cancer Screening:   Are you a current or former smoker? Current Age began smoking: 17   If you are a former smoker, what year did you quit smoking? (within 15 yrs)   To calculate your smoking history, I need an accurate estimate of how many packs of cigarettes you smoked per day and for how many years. (Not just the number of PPD you are now smoking)   Years smoking 41 x Packs per day 1  = Pack years 41   (at least 20 pack yrs)   (Make sure they understand that we need to know how much they have smoked in the past, not just the number of PPD they are smoking now)  Do you have a personal history of cancer?  No    Do you have a family history of cancer? Yes  (cancer type and and relative) mother (lung) Brother ( "all over" )  Are you coughing up blood?  No  Have you had unexplained weight loss of 15 lbs or more in the last 6 months? No  It looks like you meet all criteria.     Additional information: N/A

## 2023-11-25 ENCOUNTER — Ambulatory Visit: Admitting: Family Medicine

## 2023-12-10 LAB — HM MAMMOGRAPHY

## 2023-12-14 ENCOUNTER — Ambulatory Visit (INDEPENDENT_AMBULATORY_CARE_PROVIDER_SITE_OTHER): Payer: Self-pay | Admitting: Adult Health

## 2023-12-14 ENCOUNTER — Encounter: Payer: Self-pay | Admitting: Adult Health

## 2023-12-14 ENCOUNTER — Ambulatory Visit (HOSPITAL_COMMUNITY)
Admission: RE | Admit: 2023-12-14 | Discharge: 2023-12-14 | Disposition: A | Discharge status: Home / Self Care | Payer: Self-pay | Source: Ambulatory Visit | Attending: Acute Care | Admitting: Acute Care

## 2023-12-14 DIAGNOSIS — Z87891 Personal history of nicotine dependence: Secondary | ICD-10-CM | POA: Diagnosis present

## 2023-12-14 DIAGNOSIS — F17210 Nicotine dependence, cigarettes, uncomplicated: Secondary | ICD-10-CM | POA: Insufficient documentation

## 2023-12-14 DIAGNOSIS — Z122 Encounter for screening for malignant neoplasm of respiratory organs: Secondary | ICD-10-CM | POA: Insufficient documentation

## 2023-12-14 NOTE — Patient Instructions (Signed)

## 2023-12-14 NOTE — Progress Notes (Signed)
  Virtual Visit via Telephone Note  I connected with Onnolee Maywald , 12/14/23 9:34 AM by a telemedicine application and verified that I am speaking with the correct person using two identifiers.  Location: Patient: home Provider: home   I discussed the limitations of evaluation and management by telemedicine and the availability of in person appointments. The patient expressed understanding and agreed to proceed.   Shared Decision Making Visit Lung Cancer Screening Program [phone])   Eligibility: 58 y.o. Pack Years Smoking History Calculation = 41 pack years  (# packs/per year x # years smoked) Recent History of coughing up blood  no Unexplained weight loss? no ( >Than 15 pounds within the last 6 months ) Prior History Lung / other cancer no (Diagnosis within the last 5 years already requiring surveillance chest CT Scans). Smoking Status Former Smoker Former Smokers: Years since quit: < 1 year  Quit Date: 09/2023   Visit Components: Discussion included one or more decision making aids. YES Discussion included risk/benefits of screening. YES Discussion included potential follow up diagnostic testing for abnormal scans. YES Discussion included meaning and risk of over diagnosis. YES Discussion included meaning and risk of False Positives. YES Discussion included meaning of total radiation exposure. YES  Counseling Included: Importance of adherence to annual lung cancer LDCT screening. YES Impact of comorbidities on ability to participate in the program. YES Ability and willingness to under diagnostic treatment. YES  Smoking Cessation Counseling: Former Smokers:  Discussed the importance of maintaining cigarette abstinence. yes Diagnosis Code: Personal History of Nicotine Dependence. N62.952 Information about tobacco cessation classes and interventions provided to patient. Yes Patient provided with "ticket" for LDCT Scan. yes Written Order for Lung Cancer Screening with LDCT  placed in Epic. Yes (CT Chest Lung Cancer Screening Low Dose W/O CM) [id number]  Z12.2-Screening of respiratory organs Z87.891-Personal history of nicotine dependence   [place] Dose 12/14/23

## 2024-01-06 ENCOUNTER — Other Ambulatory Visit: Payer: Self-pay | Admitting: Acute Care

## 2024-01-06 DIAGNOSIS — F17210 Nicotine dependence, cigarettes, uncomplicated: Secondary | ICD-10-CM

## 2024-01-06 DIAGNOSIS — Z122 Encounter for screening for malignant neoplasm of respiratory organs: Secondary | ICD-10-CM

## 2024-01-06 DIAGNOSIS — Z87891 Personal history of nicotine dependence: Secondary | ICD-10-CM
